# Patient Record
Sex: Female | Born: 1990
Health system: Southern US, Community
[De-identification: ages and names within clinical notes are randomized; demographics above are authoritative.]

## PROBLEM LIST (undated history)

## (undated) DIAGNOSIS — K8010 Calculus of gallbladder with chronic cholecystitis without obstruction: Principal | ICD-10-CM

## (undated) DIAGNOSIS — R011 Cardiac murmur, unspecified: Secondary | ICD-10-CM

## (undated) DIAGNOSIS — R519 Headache, unspecified: Secondary | ICD-10-CM

## (undated) DIAGNOSIS — R51 Headache: Secondary | ICD-10-CM

## (undated) DIAGNOSIS — K8020 Calculus of gallbladder without cholecystitis without obstruction: Secondary | ICD-10-CM

## (undated) HISTORY — DX: Calculus of gallbladder with chronic cholecystitis without obstruction: K80.10

## (undated) HISTORY — DX: Cardiac murmur, unspecified: R01.1

## (undated) HISTORY — DX: Headache: R51

## (undated) HISTORY — DX: Headache, unspecified: R51.9

## (undated) HISTORY — PX: CHOLECYSTECTOMY: SHX55

---

## 2012-12-30 ENCOUNTER — Other Ambulatory Visit: Payer: Self-pay | Admitting: Internal Medicine

## 2012-12-30 DIAGNOSIS — R1013 Epigastric pain: Secondary | ICD-10-CM

## 2012-12-31 ENCOUNTER — Other Ambulatory Visit: Payer: Self-pay | Admitting: Internal Medicine

## 2012-12-31 ENCOUNTER — Ambulatory Visit
Admission: RE | Admit: 2012-12-31 | Discharge: 2012-12-31 | Disposition: A | Discharge status: Home / Self Care | Payer: Federal, State, Local not specified - PPO | Source: Ambulatory Visit | Attending: Internal Medicine | Admitting: Internal Medicine

## 2012-12-31 DIAGNOSIS — R1013 Epigastric pain: Secondary | ICD-10-CM

## 2013-01-15 ENCOUNTER — Encounter (INDEPENDENT_AMBULATORY_CARE_PROVIDER_SITE_OTHER): Payer: Self-pay | Admitting: Surgery

## 2013-01-15 ENCOUNTER — Ambulatory Visit (INDEPENDENT_AMBULATORY_CARE_PROVIDER_SITE_OTHER): Payer: Federal, State, Local not specified - PPO | Admitting: Surgery

## 2013-01-15 VITALS — BP 116/82 | HR 72 | Temp 98.4°F | Resp 18 | Ht 66.0 in | Wt 222.6 lb

## 2013-01-15 DIAGNOSIS — E669 Obesity, unspecified: Secondary | ICD-10-CM

## 2013-01-15 DIAGNOSIS — K8010 Calculus of gallbladder with chronic cholecystitis without obstruction: Secondary | ICD-10-CM

## 2013-01-15 HISTORY — DX: Calculus of gallbladder with chronic cholecystitis without obstruction: K80.10

## 2013-01-15 NOTE — Progress Notes (Signed)
Subjective:     Patient ID: Brittney Blevins, female   DOB: Apr 25, 1991, 22 y.o.   MRN: [id number]  HPI  Brittney Blevins  Apr 04, 1991 [id number]  Patient Care Team: Thayer Headings, MD as PCP - General (Internal Medicine)  This patient is a 22 y.o.female who presents today for surgical evaluation at the request of Dr. Thea Silversmith.   Reason for evaluation: Intermittent epigastric pain with gallstones.  Pleasant obese healthy female.  Had severe episode of epigastric pain in 2012.  Nausea and bloating.  Has had intermittent attacks since.  They only last a few hours.  Often associated with eating but not always.  Sometime she has woken up in the morning with it.  No heartburn or reflux.  No relief with TUMS or milk of magnesia.  Has noticed her bowel movements have become more lead looser recently.  Walks easily.  Did a 1/2 mile walk several times a week.  Has been trying to lose weight.  Was on an HCG injection regimen.  She lost 30 pounds.  She started getting more attacks.  She has gained about 10 pounds back.  Had an ultrasound done which showed gallstones.  Recalls that her mother had to have her gallbladder removed in her 30s.  No history of falls or trauma.  She is a Consulting civil engineer at [employer].  She also as a IT sales professional at [employer].  There is no problem list on file for this patient.   History reviewed. No pertinent past medical history.  History reviewed. No pertinent past surgical history.  History   Social History  . Marital Status: Single    Spouse Name: N/A    Number of Children: N/A  . Years of Education: N/A   Occupational History  . Not on file.   Social History Main Topics  . Smoking status: Never Smoker   . Smokeless tobacco: Never Used  . Alcohol Use: Yes     Comment: Social  . Drug Use: No  . Sexually Active:    Other Topics Concern  . Not on file   Social History Narrative  . No narrative on file    History reviewed. No pertinent family history.  No  current outpatient prescriptions on file.     No Known Allergies  BP 116/82  Pulse 72  Temp 98.4 F (36.9 C) (Temporal)  Resp 18  Ht 5\' 6"  (1.676 m)  Wt 222 lb 9.6 oz (100.971 kg)  BMI 35.93 kg/m2  US Abdomen Limited Ruq  12/31/2012  *RADIOLOGY REPORT*  Clinical Data:  Intermittent epigastric pain.  LIMITED ABDOMINAL ULTRASOUND - RIGHT UPPER QUADRANT  Comparison:  None.  Findings:  Gallbladder:  There are two mobile echogenic gallstones.  The largest stone measures 1 cm in size.  Gallstones demonstrate posterior acoustic shadowing.  There is no evidence for gallbladder wall thickening or a sonographic Murphy's sign.  Common bile duct:  Measures 0.4 cm.  Liver:  Normal appearance of the liver parenchyma.  No focal abnormality.  Main portal vein is patent.  IMPRESSION: Cholelithiasis.                    Original Report Authenticated By: Richarda Overlie, M.D.      Review of Systems  Constitutional: Negative for fever, chills, diaphoresis, appetite change and fatigue.  HENT: Negative for ear pain, sore throat, trouble swallowing, neck pain and ear discharge.   Eyes: Negative for photophobia, discharge and visual disturbance.  Respiratory: Negative for cough, choking,  chest tightness and shortness of breath.   Cardiovascular: Negative for chest pain and palpitations.  Gastrointestinal: Positive for nausea, abdominal pain and abdominal distention. Negative for vomiting, diarrhea, constipation, blood in stool, anal bleeding and rectal pain.       No personal nor family history of GI/colon cancer, inflammatory bowel disease, irritable bowel syndrome, allergy such as Celiac Sprue, dietary/dairy problems, colitis, ulcers nor gastritis.  No recent sick contacts/gastroenteritis.  No travel outside the country.  No changes in diet.  No dysphagia to solids or liquids.  No heartburn or reflux supine or bending over.  Some postprandial bloating.  Genitourinary: Negative for dysuria, frequency and difficulty  urinating.  Musculoskeletal: Negative for myalgias and gait problem.  Skin: Negative for color change, pallor and rash.  Neurological: Negative for dizziness, speech difficulty, weakness and numbness.  Hematological: Negative for adenopathy.  Psychiatric/Behavioral: Negative for confusion and agitation. The patient is not nervous/anxious.        Objective:   Physical Exam  Constitutional: She is oriented to person, place, and time. She appears well-developed and well-nourished. No distress.  HENT:  Head: Normocephalic.  Mouth/Throat: Oropharynx is clear and moist. No oropharyngeal exudate.  Eyes: Conjunctivae normal and EOM are normal. Pupils are equal, round, and reactive to light. No scleral icterus.  Neck: Normal range of motion. Neck supple. No tracheal deviation present.  Cardiovascular: Normal rate, regular rhythm and intact distal pulses.   Pulmonary/Chest: Effort normal and breath sounds normal. No respiratory distress. She exhibits no tenderness.  Abdominal: Soft. She exhibits no distension and no mass. There is no tenderness. Hernia confirmed negative in the right inguinal area and confirmed negative in the left inguinal area.       Pear-shaped body habitus.  Obese but soft  Genitourinary: No vaginal discharge found.  Musculoskeletal: Normal range of motion. She exhibits no tenderness.  Lymphadenopathy:    She has no cervical adenopathy.       Right: No inguinal adenopathy present.       Left: No inguinal adenopathy present.  Neurological: She is alert and oriented to person, place, and time. No cranial nerve deficit. She exhibits normal muscle tone. Coordination normal.  Skin: Skin is warm and dry. No rash noted. She is not diaphoretic. No erythema.  Psychiatric: She has a normal mood and affect. Her behavior is normal. Judgment and thought content normal.       Assessment:     Gallstones with intermittent epigastric pain and nausea and bloating for the past two years.   Strongly suspicious for chronic cholecystitis.  Heartburn reflux and rest of the differential less likely.    Plan:     I suspect she needs a cholecystectomy for symptomatic cholecystolithiasis with probable chronic cholecystitis.  I offered her several options.  One is to consider a gastroenterology consultation With possible EGD and other workup to rule out other etiologies.  Another is a trial of proton pump inhibitors omeprazole 20 mg by mouth twice a day.  If not better by three weeks, consider surgery.  The most aggressive and probably most reasonable option is to do a cholecystectomy given the fact that the rest of the differential diagnosis seems unlikely.  She would rather proceed with surgery first.  She would like to plan this or on the time of [facility] spring break in mid March.  I did discuss it with her:  The anatomy & physiology of hepatobiliary & pancreatic function was discussed.  The pathophysiology of gallbladder dysfunction  was discussed.  Natural history risks without surgery was discussed.   I feel the risks of no intervention will lead to serious problems that outweigh the operative risks; therefore, I recommended cholecystectomy to remove the pathology.  I explained laparoscopic techniques with possible need for an open approach.  Probable cholangiogram to evaluate the bilary tract was explained as well.    Risks such as bleeding, infection, abscess, leak, injury to other organs, need for further treatment, heart attack, death, and other risks were discussed.  I noted a good likelihood this will help address the problem.  Possibility that this will not correct all abdominal symptoms was explained.  Goals of post-operative recovery were discussed as well.  We will work to minimize complications.  An educational handout further explaining the pathology and treatment options was given as well.  Questions were answered.  The patient expresses understanding & wishes to proceed with  surgery.

## 2013-01-15 NOTE — Patient Instructions (Signed)
See the Handout(s) we gave you.  Consider surgery.  Please call our office at [phone] if you wish to schedule surgery or if you have further questions / concerns.   Consider a trial of aggressive and has since with a proton pump inhibitor (omeprazole 20 mg two times a day) for three weeks.  If you have heartburn as the source, that should control it by three weeks.  Cholelithiasis Cholelithiasis (also called gallstones) is a form of gallbladder disease where gallstones form in your gallbladder. The gallbladder is a non-essential organ that stores bile made in the liver, which helps digest fats. Gallstones begin as small crystals and slowly grow into stones. Gallstone pain occurs when the gallbladder spasms, and a gallstone is blocking the duct. Pain can also occur when a stone passes out of the duct.  Women are more likely to develop gallstones than men. Other factors that increase the risk of gallbladder disease are:  Having multiple pregnancies. Physicians sometimes advise removing diseased gallbladders before future pregnancies.  Obesity.  Diets heavy in fried foods and fat.  Increasing age (older than 36).  Prolonged use of medications containing female hormones.  Diabetes mellitus.  Rapid weight loss.  Family history of gallstones (heredity). SYMPTOMS  Feeling sick to your stomach (nauseous).  Abdominal pain.  Yellowing of the skin (jaundice).  Sudden pain. It may persist from several minutes to several hours.  Worsening pain with deep breathing or when jarred.  Fever.  Tenderness to the touch. In some cases, when gallstones do not move into the bile duct, people have no pain or symptoms. These are called "silent" gallstones. TREATMENT In severe cases, emergency surgery may be required. HOME CARE INSTRUCTIONS   Only take over-the-counter or prescription medicines for pain, discomfort, or fever as directed by your caregiver.  Follow a low-fat diet until seen  again. Fat causes the gallbladder to contract, which can result in pain.  Follow up as instructed. Attacks are almost always recurrent and surgery is usually required for permanent treatment. SEEK IMMEDIATE MEDICAL CARE IF:   Your pain increases and is not controlled by medications.  You have an oral temperature above 102 F (38.9 C), not controlled by medication.  You develop nausea and vomiting. MAKE SURE YOU:   Understand these instructions.  Will watch your condition.  Will get help right away if you are not doing well or get worse. Document Released: 12/07/2005 Document Revised: 03/04/2012 Document Reviewed: 02/09/2011 [facility] Patient Information 2013 [place], [place].

## 2013-02-14 ENCOUNTER — Encounter (HOSPITAL_COMMUNITY): Payer: Self-pay | Admitting: Pharmacy Technician

## 2013-02-18 NOTE — Patient Instructions (Addendum)
19 Brittney Blevins  02/18/2013   Your procedure is scheduled on: 3-7  -2014  Report to [facility] at    0530    AM .  Call this number if you have problems the morning of surgery: [phone]  Or Presurgical Testing [phone](Haim Hansson)      Do not eat food:After Midnight.    Take these medicines the morning of surgery with A SIP OF WATER: none.   Do not wear jewelry, make-up or nail polish.  Do not wear lotions, powders, or perfumes. You may wear deodorant.  Do not shave 12 hours prior to first CHG shower(legs and under arms).(face and neck okay.)  Do not bring valuables to the hospital.  Contacts, dentures or bridgework,body piercing,  may not be worn into surgery.  Leave suitcase in the car. After surgery it may be brought to your room.  For patients admitted to the hospital, checkout time is 11:00 AM the day of discharge.   Patients discharged the day of surgery will not be allowed to drive home. Must have responsible person with you x 24 hours once discharged.  Name and phone number of your driver: Mother-[name]-[phone]  Special Instructions: CHG(Chlorhedine 4%-"Hibiclens","Betasept","Aplicare") Shower Use Special Wash: see special instructions.(avoid face and genitals)   Please read over the following fact sheets that you were given: MRSA Information.    Failure to follow these instructions may result in Cancellation of your surgery.   Patient signature_______________________________________________________

## 2013-02-19 ENCOUNTER — Encounter (HOSPITAL_COMMUNITY)
Admission: RE | Admit: 2013-02-19 | Discharge: 2013-02-19 | Disposition: A | Discharge status: Home / Self Care | Payer: Federal, State, Local not specified - PPO | Source: Ambulatory Visit | Attending: Surgery | Admitting: Surgery

## 2013-02-19 ENCOUNTER — Encounter (HOSPITAL_COMMUNITY): Payer: Self-pay

## 2013-02-19 DIAGNOSIS — K8020 Calculus of gallbladder without cholecystitis without obstruction: Secondary | ICD-10-CM

## 2013-02-19 HISTORY — DX: Calculus of gallbladder without cholecystitis without obstruction: K80.20

## 2013-02-19 LAB — HCG, SERUM, QUALITATIVE: Preg, Serum: NEGATIVE

## 2013-02-19 LAB — SURGICAL PCR SCREEN: Staphylococcus aureus: NEGATIVE

## 2013-02-21 ENCOUNTER — Encounter (INDEPENDENT_AMBULATORY_CARE_PROVIDER_SITE_OTHER): Payer: Self-pay

## 2013-02-27 NOTE — Anesthesia Preprocedure Evaluation (Addendum)

## 2013-02-28 ENCOUNTER — Encounter (HOSPITAL_COMMUNITY)
Admission: RE | Disposition: A | Discharge status: Home / Self Care | Payer: Self-pay | Source: Ambulatory Visit | Attending: Surgery

## 2013-02-28 ENCOUNTER — Ambulatory Visit (HOSPITAL_COMMUNITY): Payer: Federal, State, Local not specified - PPO | Admitting: Anesthesiology

## 2013-02-28 ENCOUNTER — Encounter (HOSPITAL_COMMUNITY): Payer: Self-pay | Admitting: *Deleted

## 2013-02-28 ENCOUNTER — Ambulatory Visit (HOSPITAL_COMMUNITY)
Admission: RE | Admit: 2013-02-28 | Discharge: 2013-02-28 | Disposition: A | Discharge status: Home / Self Care | Payer: Federal, State, Local not specified - PPO | Source: Ambulatory Visit | Attending: Surgery | Admitting: Surgery

## 2013-02-28 ENCOUNTER — Encounter (HOSPITAL_COMMUNITY): Payer: Self-pay | Admitting: Anesthesiology

## 2013-02-28 ENCOUNTER — Ambulatory Visit (HOSPITAL_COMMUNITY): Payer: Federal, State, Local not specified - PPO

## 2013-02-28 DIAGNOSIS — K8010 Calculus of gallbladder with chronic cholecystitis without obstruction: Secondary | ICD-10-CM

## 2013-02-28 DIAGNOSIS — Z01812 Encounter for preprocedural laboratory examination: Secondary | ICD-10-CM | POA: Insufficient documentation

## 2013-02-28 HISTORY — PX: LAPAROSCOPIC CHOLECYSTECTOMY SINGLE PORT: SHX5891

## 2013-02-28 SURGERY — LAPAROSCOPIC CHOLECYSTECTOMY SINGLE SITE
Anesthesia: General | Wound class: Clean Contaminated

## 2013-02-28 MED ORDER — PROPOFOL 10 MG/ML IV BOLUS
INTRAVENOUS | Status: DC | PRN
  Administered 2013-02-28: 200 mg via INTRAVENOUS

## 2013-02-28 MED ORDER — FENTANYL CITRATE 0.05 MG/ML IJ SOLN
INTRAMUSCULAR | Status: AC
  Filled 2013-02-28: qty 2

## 2013-02-28 MED ORDER — ACETAMINOPHEN 325 MG PO TABS: 650.0000 mg | ORAL_TABLET | ORAL | Status: DC | PRN

## 2013-02-28 MED ORDER — CHLORHEXIDINE GLUCONATE 4 % EX LIQD
1.0000 "application " | Freq: Once | CUTANEOUS | Status: DC
  Filled 2013-02-28: qty 15

## 2013-02-28 MED ORDER — BUPIVACAINE-EPINEPHRINE 0.25% -1:200000 IJ SOLN
INTRAMUSCULAR | Status: DC | PRN
  Administered 2013-02-28: 50 mL

## 2013-02-28 MED ORDER — DEXAMETHASONE SODIUM PHOSPHATE 10 MG/ML IJ SOLN
INTRAMUSCULAR | Status: DC | PRN
  Administered 2013-02-28: 10 mg via INTRAVENOUS

## 2013-02-28 MED ORDER — PHENTERMINE HCL 37.5 MG PO TABS: 37.5000 mg | ORAL_TABLET | Freq: Every day | ORAL | Status: DC

## 2013-02-28 MED ORDER — OXYCODONE HCL 5 MG PO TABS
5.0000 mg | ORAL_TABLET | ORAL | Status: DC | PRN
  Administered 2013-02-28: 5 mg via ORAL
  Filled 2013-02-28: qty 1

## 2013-02-28 MED ORDER — ACETAMINOPHEN 10 MG/ML IV SOLN
INTRAVENOUS | Status: AC
  Filled 2013-02-28: qty 100

## 2013-02-28 MED ORDER — ROCURONIUM BROMIDE 100 MG/10ML IV SOLN
INTRAVENOUS | Status: DC | PRN
  Administered 2013-02-28: 10 mg via INTRAVENOUS
  Administered 2013-02-28: 60 mg via INTRAVENOUS

## 2013-02-28 MED ORDER — ACETAMINOPHEN 10 MG/ML IV SOLN
INTRAVENOUS | Status: DC | PRN
  Administered 2013-02-28: 1000 mg via INTRAVENOUS

## 2013-02-28 MED ORDER — SODIUM CHLORIDE 0.9 % IJ SOLN: 3.0000 mL | INTRAMUSCULAR | Status: DC | PRN

## 2013-02-28 MED ORDER — KETOROLAC TROMETHAMINE 30 MG/ML IJ SOLN
INTRAMUSCULAR | Status: DC | PRN
  Administered 2013-02-28: 30 mg via INTRAVENOUS

## 2013-02-28 MED ORDER — ACETAMINOPHEN 650 MG RE SUPP
650.0000 mg | RECTAL | Status: DC | PRN
  Filled 2013-02-28: qty 1

## 2013-02-28 MED ORDER — SODIUM CHLORIDE 0.9 % IJ SOLN: 3.0000 mL | Freq: Two times a day (BID) | INTRAMUSCULAR | Status: DC

## 2013-02-28 MED ORDER — ONDANSETRON HCL 4 MG/2ML IJ SOLN: 4.0000 mg | Freq: Four times a day (QID) | INTRAMUSCULAR | Status: DC | PRN

## 2013-02-28 MED ORDER — ONDANSETRON HCL 4 MG/2ML IJ SOLN
INTRAMUSCULAR | Status: DC | PRN
  Administered 2013-02-28: 4 mg via INTRAVENOUS

## 2013-02-28 MED ORDER — IOHEXOL 300 MG/ML  SOLN
INTRAMUSCULAR | Status: AC
  Filled 2013-02-28: qty 1

## 2013-02-28 MED ORDER — OXYCODONE HCL 5 MG PO TABS: 5.0000 mg | ORAL_TABLET | ORAL | Status: DC | PRN

## 2013-02-28 MED ORDER — SODIUM CHLORIDE 0.9 % IV SOLN: 250.0000 mL | INTRAVENOUS | Status: DC | PRN

## 2013-02-28 MED ORDER — MIDAZOLAM HCL 5 MG/5ML IJ SOLN
INTRAMUSCULAR | Status: DC | PRN
  Administered 2013-02-28: 2 mg via INTRAVENOUS

## 2013-02-28 MED ORDER — GLYCOPYRROLATE 0.2 MG/ML IJ SOLN
INTRAMUSCULAR | Status: DC | PRN
  Administered 2013-02-28: 0.6 mg via INTRAVENOUS

## 2013-02-28 MED ORDER — FENTANYL CITRATE 0.05 MG/ML IJ SOLN: 25.0000 ug | INTRAMUSCULAR | Status: DC | PRN

## 2013-02-28 MED ORDER — EPHEDRINE SULFATE 50 MG/ML IJ SOLN
INTRAMUSCULAR | Status: DC | PRN
  Administered 2013-02-28: 10 mg via INTRAVENOUS

## 2013-02-28 MED ORDER — LACTATED RINGERS IV SOLN: INTRAVENOUS | Status: DC

## 2013-02-28 MED ORDER — STERILE WATER FOR IRRIGATION IR SOLN
Status: DC | PRN
  Administered 2013-02-28: 1500 mL

## 2013-02-28 MED ORDER — BUPIVACAINE-EPINEPHRINE 0.5% -1:200000 IJ SOLN
INTRAMUSCULAR | Status: AC
  Filled 2013-02-28: qty 1

## 2013-02-28 MED ORDER — LACTATED RINGERS IV SOLN
INTRAVENOUS | Status: DC | PRN
  Administered 2013-02-28 (×2): via INTRAVENOUS

## 2013-02-28 MED ORDER — FENTANYL CITRATE 0.05 MG/ML IJ SOLN
INTRAMUSCULAR | Status: DC | PRN
  Administered 2013-02-28: 50 ug via INTRAVENOUS
  Administered 2013-02-28 (×2): 100 ug via INTRAVENOUS

## 2013-02-28 MED ORDER — BUPIVACAINE-EPINEPHRINE 0.25% -1:200000 IJ SOLN
INTRAMUSCULAR | Status: AC
  Filled 2013-02-28: qty 1

## 2013-02-28 MED ORDER — NEOSTIGMINE METHYLSULFATE 1 MG/ML IJ SOLN
INTRAMUSCULAR | Status: DC | PRN
  Administered 2013-02-28: 4 mg via INTRAVENOUS

## 2013-02-28 MED ORDER — FENTANYL CITRATE 0.05 MG/ML IJ SOLN
25.0000 ug | INTRAMUSCULAR | Status: DC | PRN
  Administered 2013-02-28 (×2): 50 ug via INTRAVENOUS

## 2013-02-28 MED ORDER — 0.9 % SODIUM CHLORIDE (POUR BTL) OPTIME
TOPICAL | Status: DC | PRN
  Administered 2013-02-28: 1000 mL

## 2013-02-28 MED ORDER — LIDOCAINE HCL (PF) 2 % IJ SOLN
INTRAMUSCULAR | Status: DC | PRN
  Administered 2013-02-28: 75 mg

## 2013-02-28 SURGICAL SUPPLY — 48 items
APPLIER CLIP 5 13 M/L LIGAMAX5 (MISCELLANEOUS) ×2
APPLIER CLIP ROT 10 11.4 M/L (STAPLE)
APPLIER CLIP UNV 5X34 EPIX (ENDOMECHANICALS) ×2 IMPLANT
CABLE HIGH FREQUENCY MONO STRZ (ELECTRODE) ×2 IMPLANT
CANISTER SUCTION 2500CC (MISCELLANEOUS) ×2 IMPLANT
CLIP APPLIE 5 13 M/L LIGAMAX5 (MISCELLANEOUS) ×1 IMPLANT
CLIP APPLIE ROT 10 11.4 M/L (STAPLE) IMPLANT
CLOTH BEACON ORANGE TIMEOUT ST (SAFETY) ×2 IMPLANT
COVER MAYO STAND STRL (DRAPES) ×2 IMPLANT
DECANTER SPIKE VIAL GLASS SM (MISCELLANEOUS) ×2 IMPLANT
DRAIN CHANNEL 19F RND (DRAIN) IMPLANT
DRAPE C-ARM 42X72 X-RAY (DRAPES) ×2 IMPLANT
DRAPE LAPAROSCOPIC ABDOMINAL (DRAPES) ×2 IMPLANT
DRAPE WARM FLUID 44X44 (DRAPE) ×2 IMPLANT
DRSG TEGADERM 4X4.75 (GAUZE/BANDAGES/DRESSINGS) ×2 IMPLANT
ELECT REM PT RETURN 9FT ADLT (ELECTROSURGICAL) ×2
ELECTRODE REM PT RTRN 9FT ADLT (ELECTROSURGICAL) ×1 IMPLANT
EVACUATOR SILICONE 100CC (DRAIN) IMPLANT
GAUZE SPONGE 2X2 8PLY STRL LF (GAUZE/BANDAGES/DRESSINGS) ×1 IMPLANT
GLOVE BIOGEL PI IND STRL 7.0 (GLOVE) ×1 IMPLANT
GLOVE BIOGEL PI INDICATOR 7.0 (GLOVE) ×1
GLOVE ECLIPSE 8.0 STRL XLNG CF (GLOVE) ×2 IMPLANT
GLOVE INDICATOR 8.0 STRL GRN (GLOVE) ×2 IMPLANT
GOWN STRL NON-REIN LRG LVL3 (GOWN DISPOSABLE) IMPLANT
GOWN STRL REIN XL XLG (GOWN DISPOSABLE) ×4 IMPLANT
IV LACTATED RINGER IRRG 3000ML (IV SOLUTION) ×1
IV LR IRRIG 3000ML ARTHROMATIC (IV SOLUTION) ×1 IMPLANT
KIT BASIN OR (CUSTOM PROCEDURE TRAY) ×2 IMPLANT
NS IRRIG 1000ML POUR BTL (IV SOLUTION) ×2 IMPLANT
POUCH SPECIMEN RETRIEVAL 10MM (ENDOMECHANICALS) IMPLANT
SCALPEL HARMONIC ACE (MISCELLANEOUS) ×2 IMPLANT
SCISSORS LAP 5X35 DISP (ENDOMECHANICALS) ×2 IMPLANT
SET CHOLANGIOGRAPH MIX (MISCELLANEOUS) ×2 IMPLANT
SET IRRIG TUBING LAPAROSCOPIC (IRRIGATION / IRRIGATOR) ×2 IMPLANT
SOLUTION ANTI FOG 6CC (MISCELLANEOUS) IMPLANT
SPONGE GAUZE 2X2 STER 10/PKG (GAUZE/BANDAGES/DRESSINGS) ×1
STRIP CLOSURE SKIN 1/2X4 (GAUZE/BANDAGES/DRESSINGS) IMPLANT
SUT MNCRL AB 4-0 PS2 18 (SUTURE) ×2 IMPLANT
SUT VICRYL 0 TIES 12 18 (SUTURE) IMPLANT
SUT VICRYL 0 UR6 27IN ABS (SUTURE) ×2 IMPLANT
TOWEL OR 17X26 10 PK STRL BLUE (TOWEL DISPOSABLE) ×2 IMPLANT
TRAY LAP CHOLE (CUSTOM PROCEDURE TRAY) ×2 IMPLANT
TROCAR 5M 150ML BLDLS (TROCAR) ×2 IMPLANT
TROCAR BLADELESS OPT 5 100 (ENDOMECHANICALS) ×2 IMPLANT
TROCAR BLADELESS OPT 5 150 (ENDOMECHANICALS) ×2 IMPLANT
TROCAR Z-THREAD FIOS 11X100 BL (TROCAR) IMPLANT
TROCAR Z-THREAD FIOS 5X100MM (TROCAR) ×2 IMPLANT
TUBING INSUFFLATION 10FT LAP (TUBING) ×2 IMPLANT

## 2013-02-28 NOTE — Op Note (Signed)
02/28/2013  8:45 AM  PATIENT:  Brittney Blevins  22 y.o. female  Patient Care Team: Thayer Headings, MD as PCP - General (Internal Medicine)  PRE-OPERATIVE DIAGNOSIS:  chronic cholecystitis   POST-OPERATIVE DIAGNOSIS:  chronic cholecystitis   PROCEDURE:  Procedure(s): LAPAROSCOPIC CHOLECYSTECTOMY SINGLE SITE  SURGEON:  Surgeon(s): Ardeth Sportsman, MD  ASSISTANT: RN   ANESTHESIA:   local and general  EBL:  Total I/O In: 1000 [I.V.:1000] Out: -   Delay start of Pharmacological VTE agent (>24hrs) due to surgical blood loss or risk of bleeding:  no  DRAINS: none   SPECIMEN:  Source of Specimen:  Gallbladder  DISPOSITION OF SPECIMEN:  PATHOLOGY  COUNTS:  YES  PLAN OF CARE: Discharge to home after PACU  PATIENT DISPOSITION:  PACU - hemodynamically stable.  INDICATION: Pleasant obese female with episodes of epigastric pain.  Found to have gallstones.  Differential diagnosis less likely.  I recommended cholecystectomy:  The anatomy & physiology of hepatobiliary & pancreatic function was discussed.  The pathophysiology of gallbladder dysfunction was discussed.  Natural history risks without surgery was discussed.   I feel the risks of no intervention will lead to serious problems that outweigh the operative risks; therefore, I recommended cholecystectomy to remove the pathology.  I explained laparoscopic techniques with possible need for an open approach.  Probable cholangiogram to evaluate the bilary tract was explained as well.    Risks such as bleeding, infection, abscess, leak, injury to other organs, need for further treatment, heart attack, death, and other risks were discussed.  I noted a good likelihood this will help address the problem.  Possibility that this will not correct all abdominal symptoms was explained.  Goals of post-operative recovery were discussed as well.  We will work to minimize complications.  An educational handout further explaining the pathology and  treatment options was given as well.  Questions were answered.  The patient expresses understanding & wishes to proceed with surgery.   OR FINDINGS: White leathery changes the gallbladder consistent with cholecystitis.  Fibrotic cystic duct.  Unable to cannulate for cholangiogram.  Solitary 8mm gallstone.  Rest of abdomen normal.  DESCRIPTION:   The patient was identified & brought in the operating room. The patient was positioned supine with arms tucked. SCDs were active during the entire case. The patient underwent general anesthesia without any difficulty.  The abdomen was prepped and draped in a sterile fashion. A Surgical Timeout confirmed our plan.  I made a transverse curvilinear incision through the superior umbilical fold.  I placed a 5mm long port through the supraumbilical fascia using a modified Hassan cutdown technique. I began carbon dioxide insufflation. Camera inspection revealed no injury. There were no adhesions to the anterior abdominal wall supraumbilically.  I proceeded to continue with single site technique. I placed a #5 port in left upper aspect of the wound. I placed a 5 mm atraumatic grasper in the right inferior aspect of the wound.  I turned attention to the right upper quadrant.  The gallbladder fundus was elevated cephalad. I freed the peritoneal coverings between the gallbladder and the liver on the posteriolateral and anteriomedial walls. I alternated between Harmonic & blunt [place] dissection to help get a good critical view of the cystic artery and cystic duct. I did further dissection to free a few centimeters of the  gallbladder off the liver bed to get a good critical view of the infundibulum and cystic duct. I mobilized the cystic artery; and, after getting a  good 360 view, ligated the cystic artery using the Harmonic ultrasonic dissection. I skeletonized the cystic duct.  I placed a clip on the infundibulum. I did a partial cystic duct-otomy and ensured patency.  I placed a 5 [place] cholangiocatheter through a puncture site at the right subcostal ridge of the abdominal wall and directed it into the cystic duct.  However, I cannot get it to advance beyond a few millimeters.  I made a new opening more proximally.  Still cannot cannulate.  It was rather fibrotic near the junction with the common bile duct.  I decided to abort cholangiogram to avoid common bile duct injury.  I removed the cholangiocatheter. I placed clips on the cystic duct x4.  I completed cystic duct transection. I freed the gallbladder from its remaining attachments to the liver. I ensured hemostasis on the gallbladder fossa of the liver and elsewhere. I inspected the rest of the abdomen & detected no injury nor bleeding elsewhere.  I removed the gallbladder out the supraumbilical fascia. I closed the fascia transversely using 0 Vicryl interrupted stitches. A closed the skin using 4-0 monocryl stitch.  Sterile dressing was applied. The patient was extubated & arrived in the PACU in stable condition..  I had discussed postoperative care with the patient in the holding area. I am about to locate the patient's family and discuss operative findings and postoperative goals / instructions.  Instructions are written in the chart as well.

## 2013-02-28 NOTE — Anesthesia Postprocedure Evaluation (Signed)
  Anesthesia Post-op Note  Patient: Brittney Blevins  Procedure(s) Performed: Procedure(s) (LRB): LAPAROSCOPIC CHOLECYSTECTOMY SINGLE SITE (N/A)  Patient Location: PACU  Anesthesia Type: General  Level of Consciousness: awake and alert   Airway and Oxygen Therapy: Patient Spontanous Breathing  Post-op Pain: mild  Post-op Assessment: Post-op Vital signs reviewed, Patient's Cardiovascular Status Stable, Respiratory Function Stable, Patent Airway and No signs of Nausea or vomiting  Last Vitals:  Filed Vitals:   02/28/13 0930  BP: 110/63  Pulse: 66  Temp: 36.1 C  Resp: 18    Post-op Vital Signs: stable   Complications: No apparent anesthesia complications

## 2013-02-28 NOTE — Transfer of Care (Signed)
Immediate Anesthesia Transfer of Care Note  Patient: Brittney Blevins  Procedure(s) Performed: Procedure(s): LAPAROSCOPIC CHOLECYSTECTOMY SINGLE SITE (N/A)  Patient Location: PACU  Anesthesia Type:General  Level of Consciousness: awake, alert , oriented and patient cooperative  Airway & Oxygen Therapy: Patient Spontanous Breathing and Patient connected to face mask oxygen  Post-op Assessment: Report given to PACU RN and Post -op Vital signs reviewed and stable  Post vital signs: Reviewed and stable  Complications: No apparent anesthesia complications

## 2013-02-28 NOTE — H&P (Signed)
Brittney Blevins  17-Nov-1991 [id number]   This patient is a 22 y.o.female who presents today for surgical evaluation at the request of Dr. Thea Silversmith.   Reason for evaluation: Intermittent epigastric pain with gallstones.   Pleasant obese healthy female. Had severe episode of epigastric pain in 2012. Nausea and bloating. Has had intermittent attacks since. They only last a few hours. Often associated with eating but not always. Sometime she has woken up in the morning with it. No heartburn or reflux. No relief with TUMS or milk of magnesia. Has noticed her bowel movements have become more lead looser recently. Walks easily. Did a 1/2 mile walk several times a week. Has been trying to lose weight. Was on an HCG injection regimen. She lost 30 pounds. She started getting more attacks. She has gained about 10 pounds back. Had an ultrasound done which showed gallstones. Recalls that her mother had to have her gallbladder removed in her 30s. No history of falls or trauma. 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   No new events    Past Medical History  Diagnosis Date  . Cholelithiasis 02-19-13    surgery planned 02-28-13    History reviewed. No pertinent past surgical history.  History   Social History  . Marital Status: Single    Spouse Name: N/A    Number of Children: N/A  . Years of Education: N/A   Occupational History  . Not on file.   Social History Main Topics  . Smoking status: Never Smoker   . Smokeless tobacco: Never Used  . Alcohol Use: Yes     Comment: rare-Social  . Drug Use: No  . Sexually Active: No   Other Topics Concern  . Not on file   Social History Narrative  . No narrative on file    History reviewed. No pertinent family history.  Current Facility-Administered Medications  Medication Dose Route Frequency Provider Last Rate Last Dose  . chlorhexidine (HIBICLENS) 4 % liquid 1 application  1 application Topical Once  Ardeth Sportsman, MD      . Melene Muller ON 03/01/2013] chlorhexidine (HIBICLENS) 4 % liquid 1 application  1 application Topical Once Ardeth Sportsman, MD         No Known Allergies    BP 131/78  Pulse 85  Temp(Src) 98.1 F (36.7 C) (Oral)  Resp 18  SpO2 100%  LMP 02/26/2013     Results:   Labs: No results found for this or any previous visit (from the past 48 hour(s)).  Imaging / Studies: No results found.  Medications / Allergies: per chart  Antibiotics: Anti-infectives   None      Review of Systems  Constitutional: Negative for fever, chills, diaphoresis, appetite change and fatigue.  HENT: Negative for ear pain, sore throat, trouble swallowing, neck pain and ear discharge.  Eyes: Negative for photophobia, discharge and visual disturbance.  Respiratory: Negative for cough, choking, chest tightness and shortness of breath.  Cardiovascular: Negative for chest pain and palpitations.  Gastrointestinal: Positive for nausea, abdominal pain and abdominal distention. Negative for vomiting, diarrhea, constipation, blood in stool, anal bleeding and rectal pain.  No personal nor family history of GI/colon cancer, inflammatory bowel disease, irritable bowel syndrome, allergy such as Celiac Sprue, dietary/dairy problems, colitis, ulcers nor gastritis. No recent sick contacts/gastroenteritis. No travel outside the country. No changes in diet.  No dysphagia to solids or liquids. No heartburn or reflux supine or  bending over. Some postprandial bloating.  Genitourinary: Negative for dysuria, frequency and difficulty urinating.  Musculoskeletal: Negative for myalgias and gait problem.  Skin: Negative for color change, pallor and rash.  Neurological: Negative for dizziness, speech difficulty, weakness and numbness.  Hematological: Negative for adenopathy.  Psychiatric/Behavioral: Negative for confusion and agitation. The patient is not nervous/anxious.  Objective:   Physical Exam   Constitutional: She is oriented to person, place, and time. She appears well-developed and well-nourished. No distress.  HENT:  Head: Normocephalic.  Mouth/Throat: Oropharynx is clear and moist. No oropharyngeal exudate.  Eyes: Conjunctivae normal and EOM are normal. Pupils are equal, round, and reactive to light. No scleral icterus.  Neck: Normal range of motion. Neck supple. No tracheal deviation present.  Cardiovascular: Normal rate, regular rhythm and intact distal pulses.  Pulmonary/Chest: Effort normal and breath sounds normal. No respiratory distress. She exhibits no tenderness.  Abdominal: Soft. She exhibits no distension and no mass. There is no tenderness. Hernia confirmed negative in the right inguinal area and confirmed negative in the left inguinal area.  Pear-shaped body habitus. Obese but soft  Genitourinary: No vaginal discharge found.  Musculoskeletal: Normal range of motion. She exhibits no tenderness.  Lymphadenopathy:  She has no cervical adenopathy.  Right: No inguinal adenopathy present.  Left: No inguinal adenopathy present.  Neurological: She is alert and oriented to person, place, and time. No cranial nerve deficit. She exhibits normal muscle tone. Coordination normal.  Skin: Skin is warm and dry. No rash noted. She is not diaphoretic. No erythema.  Psychiatric: She has a normal mood and affect. Her behavior is normal. Judgment and thought content normal.  Assessment:   Gallstones with intermittent epigastric pain and nausea and bloating for the past two years. Strongly suspicious for chronic cholecystitis. Heartburn reflux and rest of the differential less likely.  Plan:   I suspect she needs a cholecystectomy for symptomatic cholecystolithiasis with probable chronic cholecystitis. I offered her several options. One is to consider a gastroenterology consultation With possible EGD and other workup to rule out other etiologies. Another is a trial of proton pump  inhibitors omeprazole 20 mg by mouth twice a day. If not better by three weeks, consider surgery. The most aggressive and probably most reasonable option is to do a cholecystectomy given the fact that the rest of the differential diagnosis seems unlikely. She would rather proceed with surgery first. She would like to plan this or on the time of [facility] spring break in mid March. I did discuss it with her:   The anatomy & physiology of hepatobiliary & pancreatic function was discussed. The pathophysiology of gallbladder dysfunction was discussed. Natural history risks without surgery was discussed. I feel the risks of no intervention will lead to serious problems that outweigh the operative risks; therefore, I recommended cholecystectomy to remove the pathology. I explained laparoscopic techniques with possible need for an open approach. Probable cholangiogram to evaluate the bilary tract was explained as well.  Risks such as bleeding, infection, abscess, leak, injury to other organs, need for further treatment, heart attack, death, and other risks were discussed. I noted a good likelihood this will help address the problem. Possibility that this will not correct all abdominal symptoms was explained. Goals of post-operative recovery were discussed as well. We will work to minimize complications. An educational handout further explaining the pathology and treatment options was given as well. Questions were answered. The patient expresses understanding & wishes to proceed with surgery.   I  have re-reviewed the the patient's records, history, medications, and allergies.  I have re-examined the patient.  I again discussed intraoperative plans and goals of post-operative recovery.  The patient agrees to proceed.     Ardeth Sportsman, M.D., F.A.C.S. Gastrointestinal and Minimally Invasive Surgery [facility] [address], [address] [place], [place] [address] [phone] [address]  / Paging [phone] Voice Mail   02/28/2013  CARE TEAM:  PCP: Thayer Headings, MD  Outpatient Care Team: Patient Care Team: Thayer Headings, MD as PCP - General (Internal Medicine)  Inpatient Treatment Team: Treatment Team: Attending Provider: Ardeth Sportsman, MD

## 2013-03-03 ENCOUNTER — Encounter (HOSPITAL_COMMUNITY): Payer: Self-pay | Admitting: Surgery

## 2013-03-17 ENCOUNTER — Ambulatory Visit (INDEPENDENT_AMBULATORY_CARE_PROVIDER_SITE_OTHER): Payer: Federal, State, Local not specified - PPO | Admitting: Surgery

## 2013-03-17 ENCOUNTER — Encounter (INDEPENDENT_AMBULATORY_CARE_PROVIDER_SITE_OTHER): Payer: Self-pay | Admitting: Surgery

## 2013-03-17 VITALS — BP 110/64 | HR 70 | Resp 18 | Ht 66.0 in | Wt 219.0 lb

## 2013-03-17 DIAGNOSIS — K8010 Calculus of gallbladder with chronic cholecystitis without obstruction: Secondary | ICD-10-CM

## 2013-03-17 NOTE — Patient Instructions (Addendum)
LAPAROSCOPIC SURGERY: POST OP INSTRUCTIONS  1. DIET: Follow a light bland diet the first 24 hours after arrival home, such as soup, liquids, crackers, etc.  Be sure to include lots of fluids daily.  Avoid fast food or heavy meals as your are more likely to get nauseated.  Eat a low fat the next few days after surgery.   2. Take your usually prescribed home medications unless otherwise directed. 3. PAIN CONTROL: a. Pain is best controlled by a usual combination of three different methods TOGETHER: i. Ice/Heat ii. Over the counter pain medication iii. Prescription pain medication b. Most patients will experience some swelling and bruising around the incisions.  Ice packs or heating pads (30-60 minutes up to 6 times a day) will help. Use ice for the first few days to help decrease swelling and bruising, then switch to heat to help relax tight/sore spots and speed recovery.  Some people prefer to use ice alone, heat alone, alternating between ice & heat.  Experiment to what works for you.  Swelling and bruising can take several weeks to resolve.   c. It is helpful to take an over-the-counter pain medication regularly for the first few weeks.  Choose one of the following that works best for you: i. Naproxen (Aleve, etc)  Two 236m tabs twice a day ii. Ibuprofen (Advil, etc) Three 2060mtabs four times a day (every meal & bedtime) iii. Acetaminophen (Tylenol, etc) 500-65025mour times a day (every meal & bedtime) d. A  prescription for pain medication (such as oxycodone, hydrocodone, etc) should be given to you upon discharge.  Take your pain medication as prescribed.  i. If you are having problems/concerns with the prescription medicine (does not control pain, nausea, vomiting, rash, itching, etc), please call us [phone] see if we need to switch you to a different pain medicine that will work better for you and/or control your side effect better. ii. If you need a refill on your pain medication,  please contact your pharmacy.  They will contact our office to request authorization. Prescriptions will not be filled after 5 pm or on week-ends. 4. Avoid getting constipated.  Between the surgery and the pain medications, it is common to experience some constipation.  Increasing fluid intake and taking a fiber supplement (such as Metamucil, Citrucel, FiberCon, MiraLax, etc) 1-2 times a day regularly will usually help prevent this problem from occurring.  A mild laxative (prune juice, Milk of Magnesia, MiraLax, etc) should be taken according to package directions if there are no bowel movements after 48 hours.   5. Watch out for diarrhea.  If you have many loose bowel movements, simplify your diet to bland foods & liquids for a few days.  Stop any stool softeners and decrease your fiber supplement.  Switching to mild anti-diarrheal medications (Kayopectate, Pepto Bismol) can help.  If this worsens or does not improve, please call us.[place]. Wash / shower every day.  You may shower over the dressings as they are waterproof.  Continue to shower over incision(s) after the dressing is off. 7. Remove your waterproof bandages 5 days after surgery.  You may leave the incision open to air.  You may replace a dressing/Band-Aid to cover the incision for comfort if you wish.  8. ACTIVITIES as tolerated:   a. You may resume regular (light) daily activities beginning the next day-such as daily self-care, walking, climbing stairs-gradually increasing activities as tolerated.  If you can walk 30 minutes without difficulty, it  is safe to try more intense activity such as jogging, treadmill, bicycling, low-impact aerobics, swimming, etc. b. Save the most intensive and strenuous activity for last such as sit-ups, heavy lifting, contact sports, etc  Refrain from any heavy lifting or straining until you are off narcotics for pain control.   c. DO NOT PUSH THROUGH PAIN.  Let pain be your guide: If it hurts to do something, don't do  it.  Pain is your body warning you to avoid that activity for another week until the pain goes down. d. You may drive when you are no longer taking prescription pain medication, you can comfortably wear a seatbelt, and you can safely maneuver your car and apply brakes. e. Bonita Quin may have sexual intercourse when it is comfortable.  9. FOLLOW UP in our office a. Please call [facility] at [phone] to set up an appointment to see your surgeon in the office for a follow-up appointment approximately 2-3 weeks after your surgery. b. Make sure that you call for this appointment the day you arrive home to insure a convenient appointment time. 10. IF YOU HAVE DISABILITY OR FAMILY LEAVE FORMS, BRING THEM TO THE OFFICE FOR PROCESSING.  DO NOT GIVE THEM TO YOUR DOCTOR.   WHEN TO CALL us [phone]: 1. Poor pain control 2. Reactions / problems with new medications (rash/itching, nausea, etc)  3. Fever over 101.5 F (38.5 C) 4. Inability to urinate 5. Nausea and/or vomiting 6. Worsening swelling or bruising 7. Continued bleeding from incision. 8. Increased pain, redness, or drainage from the incision   The clinic staff is available to answer your questions during regular business hours (8:30am-5pm).  Please don't hesitate to call and ask to speak to one of our nurses for clinical concerns.   If you have a medical emergency, go to the nearest emergency room or call 911.  A surgeon from [facility] is always on call at the [facility], [place] [address], [address], [place], [place]  [address] ? MAIN: [phone] ? TOLL FREE: [phone] ?  FAX [phone] [email or web address]  Managing Pain  Pain after surgery or related to activity is often due to strain/injury to muscle, tendon, nerves and/or incisions.  This pain is usually short-term and will improve in a few months.   Many people find it helpful to do the following things TOGETHER to  help speed the process of healing and to get back to regular activity more quickly:  1. Avoid heavy physical activity a.  no lifting greater than 20 pounds b. Do not "push through" the pain.  Listen to your body and avoid positions and maneuvers than reproduce the pain c. Walking is okay as tolerated, but go slowly and stop when getting sore.  d. Remember: If it hurts to do it, then don't do it! 2. Take Anti-inflammatory medication  a. Take with food/snack around the clock for 1-2 weeks i. This helps the muscle and nerve tissues become less irritable and calm down faster b. Choose ONE of the following over-the-counter medications: i. Naproxen  tabs (ex. Aleve) 1-2 pills twice a day  ii. Ibuprofen  tabs (ex. Advil, Motrin) 3-4 pills with every meal and just before bedtime iii. Acetaminophen  tabs (Tylenol) 1-2 pills with every meal and just before bedtime 3. Use a Heating pad or Ice/Cold Pack a. 4-6 times a day b. May use warm bath/hottub  or showers 4. Try Gentle Massage and/or Stretching  a.  at the area of pain many times a day b. stop if you feel pain - do not overdo it  Try these steps together to help you body heal faster and avoid making things get worse.  Doing just one of these things may not be enough.    If you are not getting better after two weeks or are noticing you are getting worse, contact our office for further advice; we may need to re-evaluate you & see what other things we can do to help.  Lactose-Free Diet Lactose is a carbohydrate that is found mainly in milk and milk products, as well as in foods with added milk or whey. Lactose must be digested by the enzyme lactase in order to be used by the body. Lactose intolerance occurs when there is a shortage of lactase. When your body is not able to digest lactose, you may feel sick to your stomach (nausea), bloated, and have cramps, gas, and diarrhea. TYPES OF LACTASE DEFICIENCY  Primary lactase deficiency.  This is the most common type. It is characterized by a slow decrease in lactase activity.  Secondary lactase deficiency. This occurs following injury to the small intestinal mucosa as a result of a disease or condition. It can also occur as a result of surgery or after treatment with antibiotic medicines or cancer drugs. Tolerance to lactose varies widely. Each person must determine how much milk can be consumed without developing symptoms. Drinking smaller portions of milk throughout the day may be helpful. Some studies suggest that slowing gastric emptying may help increase tolerance of milk products. This may be done by:  Consuming milk or milk products with a meal rather than alone.  Consuming milk with a higher fat content. There are many dairy products that may be tolerated better than milk by some people, including:  Cheese (especially aged cheese). The lactose content is much lower than in milk.  Cultured dairy products, such as yogurt, buttermilk, cottage cheese, and sweet acidophilus milk (kefir). These products are usually well tolerated by lactase-deficient people. This is because the healthy bacteria help digest lactose.  Lactose-hydrolyzed milk. This product contains 40% to 90% less lactose than milk and may also be well tolerated. ADEQUACY These diets may be deficient in calcium, riboflavin, and vitamin D, according to the Recommended Dietary Allowances of the [employer]. Depending on individual tolerances and the use of milk substitutes, milk, or other dairy products, you may be able to meet these recommendations. SPECIAL NOTES  Lactose is a carbohydrate. The main food source for lactose is dairy products. Reading food labels is important. Many products contain lactose even when they are not made from milk. Look for the following words: whey, milk solids, dry milk solids, nonfat dry milk powder. Typical sources of lactose other than dairy products include breads,  candies, cold cuts, prepared and processed foods, and commercial sauces and gravies.  All foods must be prepared without milk, cream, or other dairy foods.  A vitamin or mineral supplement may be necessary. Consult your caregiver or Registered Dietitian.  Lactose is also found in many prescription and over-the-counter medicines.  Soy milk and lactose-free supplements may be used as an alternative to milk. CHOOSING FOODS Breads and Starches  Allowed: Breads and rolls made without milk. [place], [place], or [place] bread. Soda crackers, graham crackers. Any crackers prepared without lactose. Cooked or dry cereals prepared without lactose (read labels). Any potatoes, pasta, or rice prepared without milk or lactose. Popcorn.  Avoid: Breads and  rolls that contain milk. Prepared mixes such as muffins, biscuits, waffles, pancakes. Sweet rolls, donuts, [place] toast (if made with milk or lactose). Zwieback crackers, corn curls, or any crackers that contain lactose. Cooked or dry cereals prepared with lactose (read labels). Instant potatoes, frozen [place] fries, scalloped or au gratin potatoes. Vegetables  Allowed: Fresh, frozen, and canned vegetables.  Avoid: Creamed or breaded vegetables. Vegetables in a cheese sauce or with lactose-containing margarines. Fruit  Allowed: All fresh, canned, or frozen fruits that are not processed with lactose.  Avoid: Any canned or frozen fruits processed with lactose. Meat and Meat Substitutes  Allowed: Plain beef, chicken, fish, [place], lamb, veal, pork, or ham. Kosher prepared meat products. Strained or junior meats that do not contain milk. Eggs, soy meat substitutes, nuts.  Avoid: Scrambled eggs, omelets, and souffles that contain milk. Creamed or breaded meat, fish, or fowl. Sausage products such as wieners, liver sausage, or cold cuts that contain milk solids. Cheese, cottage cheese, or cheese spreads. Milk  Allowed: None.  Avoid: Milk (whole, 2%, skim,  or chocolate). Evaporated, powdered, or condensed milk. Malted milk. Soups and Combination Foods  Allowed: Bouillon, broth, vegetable soups, clear soups, consomms. Homemade soups made with allowed ingredients. Combination or prepared foods that do not contain milk or milk products (read labels).  Avoid: Cream soups, chowders, commercially prepared soups containing lactose. Macaroni and cheese, pizza. Combination or prepared foods that contain milk or milk products. Desserts and Sweets  Allowed: Water and fruit ices, gelatin, angel food cake. Homemade cookies, pies, or cakes made from allowed ingredients. Pudding (if made with water or a milk substitute). Lactose-free tofu desserts. Sugar, honey, corn syrup, jam, jelly, marmalade, molasses (beet sugar). Pure sugar candy, marshmallows.  Avoid: Ice cream, ice milk, sherbet, custard, pudding, frozen yogurt. Commercial cake and cookie mixes. Desserts that contain chocolate. Pie crust made with milk-containing margarine. Reduced calorie desserts made with a sugar substitute that contains lactose. Toffee, peppermint, butterscotch, chocolate, caramels. Fats and Oils  Allowed: Butter (as tolerated, contains very small amounts of lactose). Margarines and dressings that do not contain milk. Vegetable oils, shortening, mayonnaise, nondairy cream and whipped toppings without lactose or milk solids added. Tomasa Blase.  Avoid: Margarines and salad dressings containing milk. Cream, cream cheese, peanut butter with added milk solids, sour cream, chip dips made with sour cream. Beverages  Allowed: Carbonated drinks, tea, coffee and freeze-dried coffee, some instant coffees (check labels). Fruit drinks, fruit and vegetable juice, rice or soy milk.  Avoid: Hot chocolate. Some cocoas, some instant coffees, instant iced teas, powdered fruit drinks (read labels). Condiments  Allowed: Soy sauce, carob powder, olives, gravy made with water, baker's cocoa, pickles, pure  seasonings and spices, wine, pure monosodium glutamate, catsup, mustard.  Avoid: Some chewing gums, chocolate, some cocoas. Certain antibiotics and vitamin or mineral preparations. Spice blends if they contain milk products. MSG extender. Artificial sweeteners that contain lactose. Some nondairy creamers (read labels). SAMPLE MENU Breakfast  Orange juice.  Banana.  Bran cereal.  Nondairy creamer.  Vienna bread, toasted.  Butter or milk-free margarine.  Coffee or tea. Lunch  Chicken breast.  Rice.  Green beans.  Butter or milk-free margarine.  Fresh melon.  Coffee or tea. Dinner  [place].  Baked potato.  Butter or milk-free margarine.  Broccoli.  Lettuce salad with vinegar and oil dressing.  [employer].  Coffee or tea. Document Released: 06/02/2002 Document Revised: 03/04/2012 Document Reviewed: 03/10/2011 [facility] Patient Information 2013 [place], [place].

## 2013-03-17 NOTE — Progress Notes (Signed)
Subjective:     Patient ID: Brittney Blevins, female   DOB: 1991-04-26, 22 y.o.   MRN: [id number]  HPI  Brittney Blevins  03/28/1991 [id number]  Patient Care Team: Thayer Headings, MD as PCP - General (Internal Medicine)  This patient is a 22 y.o.female who presents today for surgical evaluation  POST-OPERATIVE DIAGNOSIS: chronic cholecystitis  PROCEDURE: Procedure(s):  LAPAROSCOPIC CHOLECYSTECTOMY SINGLE SITE 02/28/2013  The patient comes in today in good spirits.  Underwent laparoscopic cholecystectomy two and half weeks ago.  Did not have much pain.  Eating well.  Little sensitivity to dairy with some left upper abdominal pain.  No nausea or vomiting.  No diarrhea.  No constipation.  Very happy with her results so far.  No more attacks is a relief for her.   Energy level good.  She is working with a Systems analyst.  Wishes to get back to more intense exercise but wants to make sure it is safe.  Patient Active Problem List  Diagnosis  . Obesity (BMI 30-39.9)  . Chronic cholecystitis with calculus    Past Medical History  Diagnosis Date  . Cholelithiasis 02-19-13    surgery planned 02-28-13    Past Surgical History  Procedure Laterality Date  . Laparoscopic cholecystectomy single port N/A 02/28/2013    Procedure: LAPAROSCOPIC CHOLECYSTECTOMY SINGLE SITE;  Surgeon: Ardeth Sportsman, MD;  Location: [facility];  Service: General;  Laterality: N/A;    History   Social History  . Marital Status: Single    Spouse Name: N/A    Number of Children: N/A  . Years of Education: N/A   Occupational History  . Not on file.   Social History Main Topics  . Smoking status: Never Smoker   . Smokeless tobacco: Never Used  . Alcohol Use: Yes     Comment: rare-Social  . Drug Use: No  . Sexually Active: No   Other Topics Concern  . Not on file   Social History Narrative  . No narrative on file    History reviewed. No pertinent family history.  Current Outpatient Prescriptions   Medication Sig Dispense Refill  . oxyCODONE (OXY IR/ROXICODONE) 5 MG immediate release tablet Take 1-2 tablets (5-10 mg total) by mouth every 4 (four) hours as needed for pain.  50 tablet  0  . phentermine (ADIPEX-P) 37.5 MG tablet Take 37.5 mg by mouth daily before breakfast.       No current facility-administered medications for this visit.     No Known Allergies  BP 110/64  Pulse 70  Resp 18  Ht 5\' 6"  (1.676 m)  Wt 219 lb (99.338 kg)  BMI 35.36 kg/m2  LMP 02/01/2013  No results found.   Review of Systems  Constitutional: Negative for fever, chills and diaphoresis.  HENT: Negative for ear pain, sore throat and trouble swallowing.   Eyes: Negative for photophobia and visual disturbance.  Respiratory: Negative for cough and choking.   Cardiovascular: Negative for chest pain and palpitations.  Gastrointestinal: Negative for nausea, vomiting, abdominal pain, diarrhea, constipation, blood in stool, anal bleeding and rectal pain.  Genitourinary: Negative for dysuria, frequency and difficulty urinating.  Musculoskeletal: Negative for myalgias and gait problem.  Skin: Negative for color change, pallor and rash.  Neurological: Negative for dizziness, speech difficulty, weakness and numbness.  Hematological: Negative for adenopathy.  Psychiatric/Behavioral: Negative for confusion and agitation. The patient is not nervous/anxious.        Objective:   Physical Exam  Constitutional: She is  oriented to person, place, and time. She appears well-developed and well-nourished. No distress.  HENT:  Head: Normocephalic.  Mouth/Throat: Oropharynx is clear and moist. No oropharyngeal exudate.  Eyes: Conjunctivae and EOM are normal. Pupils are equal, round, and reactive to light. No scleral icterus.  Neck: Normal range of motion. No tracheal deviation present.  Cardiovascular: Normal rate and intact distal pulses.   Pulmonary/Chest: Effort normal. No respiratory distress. She exhibits no  tenderness.  Abdominal: Soft. She exhibits no distension. There is no tenderness. Hernia confirmed negative in the right inguinal area and confirmed negative in the left inguinal area.  Incision supraumbilical clean with normal healing ridges.  No hernias  Genitourinary: No vaginal discharge found.  Musculoskeletal: Normal range of motion. She exhibits no tenderness.  Lymphadenopathy:       Right: No inguinal adenopathy present.       Left: No inguinal adenopathy present.  Neurological: She is alert and oriented to person, place, and time. No cranial nerve deficit. She exhibits normal muscle tone. Coordination normal.  Skin: Skin is warm and dry. No rash noted. She is not diaphoretic.  Psychiatric: She has a normal mood and affect. Her behavior is normal.       Assessment:     Chronic cholecystitis status post cholecystectomy, recovering well.     Plan:     Increase activity as tolerated to regular activity.  Do not push through pain.  Start with low impact activity and gradually increased unrestricted activity over the next few weeks.  Diet as tolerated. Bowel regimen to avoid problems.  Consider lactose free diet for the next few weeks and then gradually add it back in if she has had tolerance of it in the past.  Return to clinic p.r.n.   Instructions discussed.  Followup with primary care physician for other health issues as would normally be done.  Questions answered.  The patient expressed understanding and appreciation

## 2013-05-14 ENCOUNTER — Encounter (INDEPENDENT_AMBULATORY_CARE_PROVIDER_SITE_OTHER): Payer: Self-pay

## 2013-09-16 ENCOUNTER — Ambulatory Visit (INDEPENDENT_AMBULATORY_CARE_PROVIDER_SITE_OTHER): Payer: BC Managed Care – PPO | Admitting: Physician Assistant

## 2013-09-16 VITALS — BP 116/78 | HR 80 | Temp 98.3°F | Resp 16 | Ht 66.5 in | Wt 236.6 lb

## 2013-09-16 DIAGNOSIS — Z2089 Contact with and (suspected) exposure to other communicable diseases: Secondary | ICD-10-CM

## 2013-09-16 DIAGNOSIS — Z207 Contact with and (suspected) exposure to pediculosis, acariasis and other infestations: Secondary | ICD-10-CM

## 2013-09-16 MED ORDER — PERMETHRIN 5 % EX CREA: TOPICAL_CREAM | Freq: Once | CUTANEOUS | Status: DC

## 2013-09-16 NOTE — Patient Instructions (Addendum)
Use the permethrin cream once.  Apply before bedtime, then wash off 8-12 hours later.  That will kill any potential scabies.  The itching may persist for 1-2 weeks though as your skin heals.  Take Zyrtec/Claritin/Allegra in the morning and Benadryl at bedtime to help with the itching   Scabies Scabies are small bugs (mites) that burrow under the skin and cause red bumps and severe itching. These bugs can only be seen with a microscope. Scabies are highly contagious. They can spread easily from person to person by direct contact. They are also spread through sharing clothing or linens that have the scabies mites living in them. It is not unusual for an entire family to become infected through shared towels, clothing, or bedding.  HOME CARE INSTRUCTIONS   Your caregiver may prescribe a cream or lotion to kill the mites. If cream is prescribed, massage the cream into the entire body from the neck to the bottom of both feet. Also massage the cream into the scalp and face if your child is less than 70 year old. Avoid the eyes and mouth. Do not wash your hands after application.  Leave the cream on for 8 to 12 hours. Your child should bathe or shower after the 8 to 12 hour application period. Sometimes it is helpful to apply the cream to your child right before bedtime.  One treatment is usually effective and will eliminate approximately 95% of infestations. For severe cases, your caregiver may decide to repeat the treatment in 1 week. Everyone in your household should be treated with one application of the cream.  New rashes or burrows should not appear within 24 to 48 hours after successful treatment. However, the itching and rash may last for 2 to 4 weeks after successful treatment. Your caregiver may prescribe a medicine to help with the itching or to help the rash go away more quickly.  Scabies can live on clothing or linens for up to 3 days. All of your child's recently used clothing, towels, stuffed  toys, and bed linens should be washed in hot water and then dried in a dryer for at least 20 minutes on high heat. Items that cannot be washed should be enclosed in a plastic bag for at least 3 days.  To help relieve itching, bathe your child in a cool bath or apply cool washcloths to the affected areas.  Your child may return to school after treatment with the prescribed cream. SEEK MEDICAL CARE IF:   The itching persists longer than 4 weeks after treatment.  The rash spreads or becomes infected. Signs of infection include red blisters or yellow-tan crust. Document Released: 12/11/2005 Document Revised: 03/04/2012 Document Reviewed: 04/21/2009 [facility] Patient Information 2014 [place], [place].

## 2013-09-16 NOTE — Progress Notes (Signed)
  Subjective:    Patient ID: Brittney Blevins, female    DOB: [birth year], 22 y.o.   MRN: [id number]  HPI   Brittney Blevins is a very pleasant 22 yr old female here with concern for scabies.  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  Pt admits that after googling her scabies, she does not think she has it, but has been concerned nonetheless.  Reports she as noted bumps on her skin occasionally over the last month - appear in random places, sometimes itchy, resolve spontaneously.  Generalized itching for the last week - though not intense, and not constant.  She denies rash.  No household contacts with symptoms.  Otherwise feels well today.     Review of Systems  Constitutional: Negative for fever and chills.  HENT: Negative.   Respiratory: Negative.   Cardiovascular: Negative.   Gastrointestinal: Negative.   Musculoskeletal: Negative.   Skin:       Itching  Neurological: Negative.        Objective:   Physical Exam  Vitals reviewed. Constitutional: She is oriented to person, place, and time. She appears well-developed and well-nourished. No distress.  HENT:  Head: Normocephalic and atraumatic.  Eyes: Conjunctivae are normal. No scleral icterus.  Cardiovascular: Normal rate, regular rhythm and normal heart sounds.   Pulmonary/Chest: Effort normal and breath sounds normal.  Neurological: She is alert and oriented to person, place, and time.  Skin: Skin is warm and dry. No rash noted.  Psychiatric: She has a normal mood and affect. Her behavior is normal.        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  Pt history and exam are not concerning for scabies.  I think it is reasonable to treat pruritus with antihistamines and monitor symptoms, but  pt would feel more comfortable treating empirically for scabies, which I do not think is unreasonable.  Permethrin cream x 1.  Zyrtec and Benadryl for itching.  RTC if worsening or not improving.

## 2013-12-09 IMAGING — US US ABDOMEN LIMITED
1 series · 14 of 25 positions shown · non-contrast
Comparison: None.

CLINICAL DATA: Intermittent epigastric pain.

LIMITED ABDOMINAL ULTRASOUND - RIGHT UPPER QUADRANT

[Series 1: us abdomen limited · 0.22mm/px · 14 of 43 slices shown]
[im 1/43]
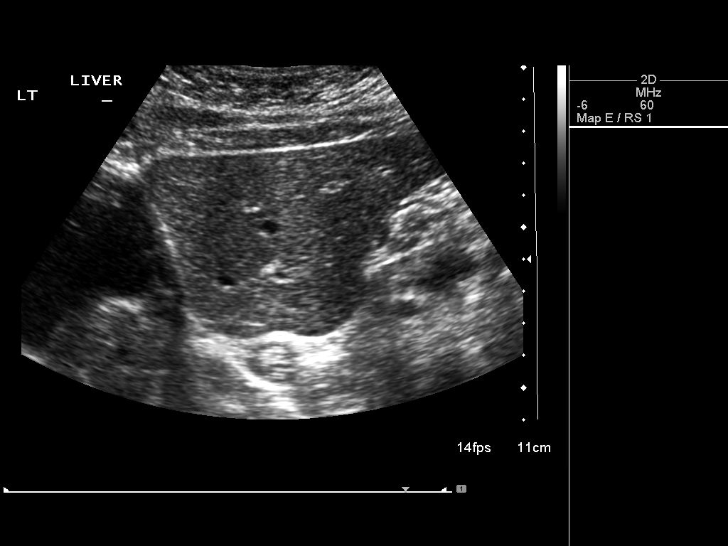
[im 4/43]
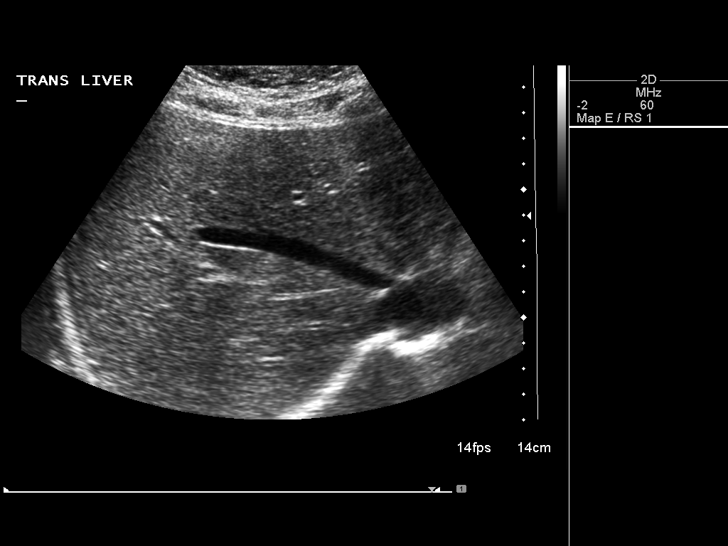
[im 8/43]
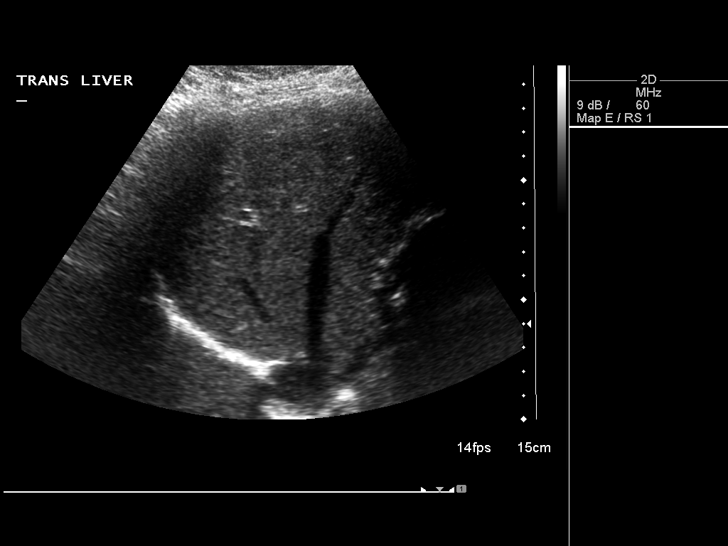
[im 11/43]
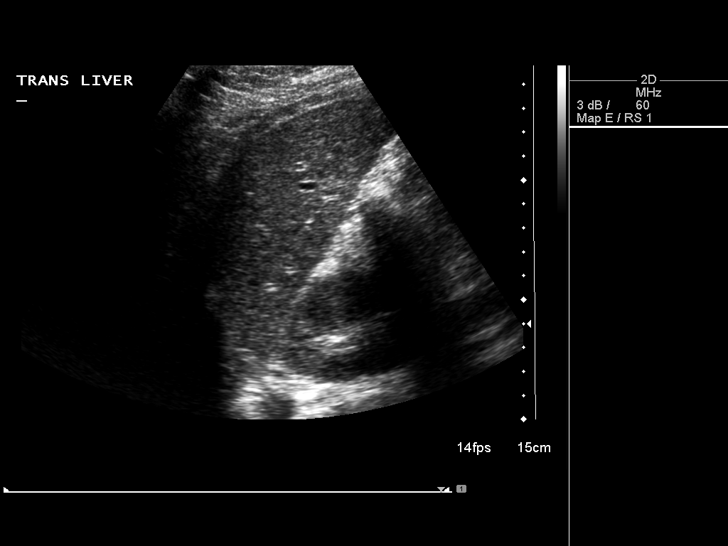
[im 15/43]
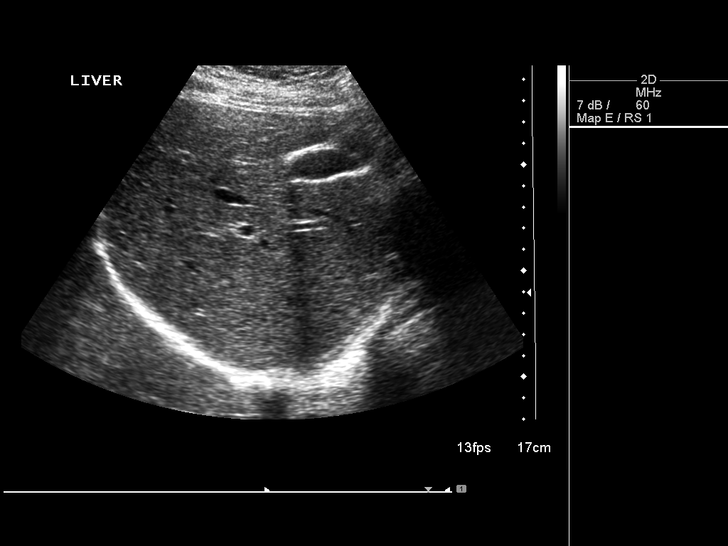
[im 16/43]
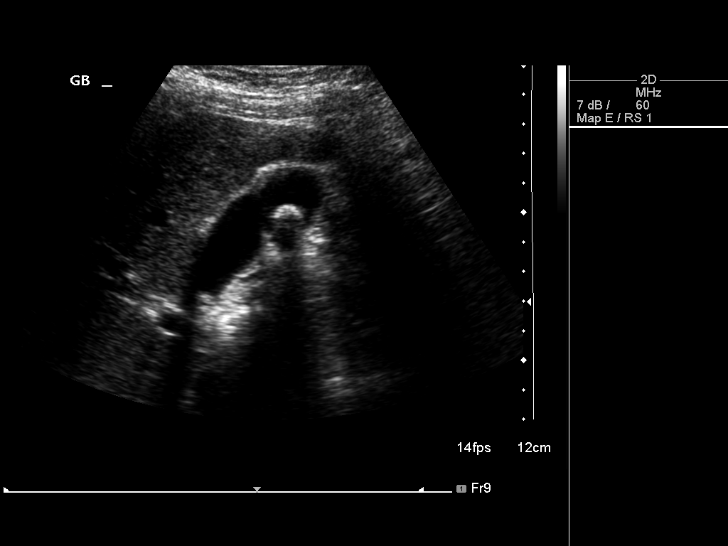
[im 20/43]
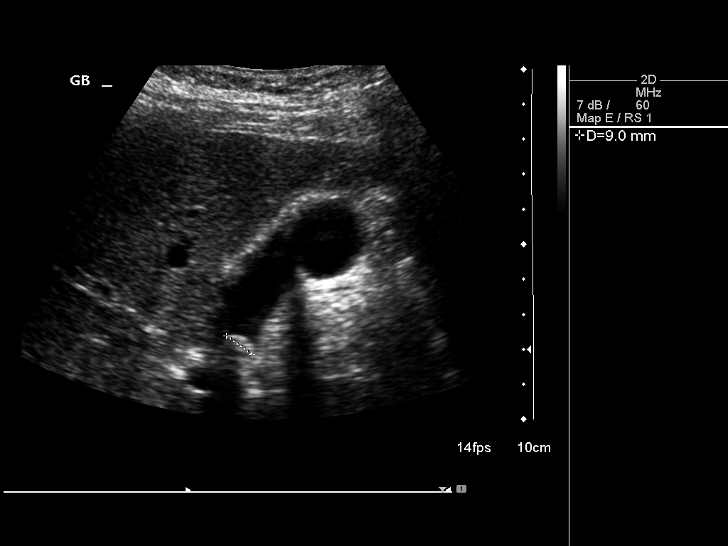
[im 23/43]
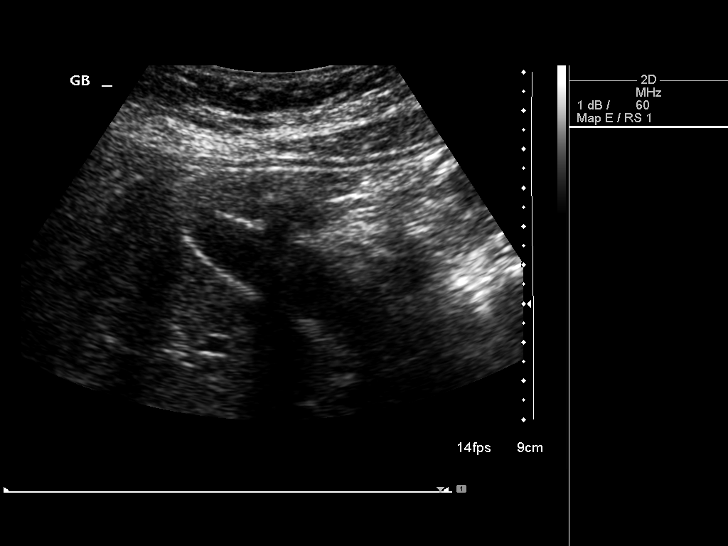
[im 27/43]
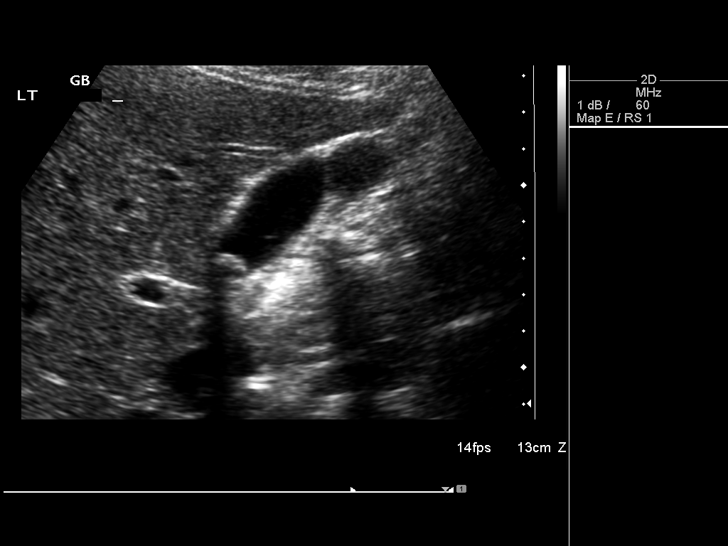
[im 29/43]
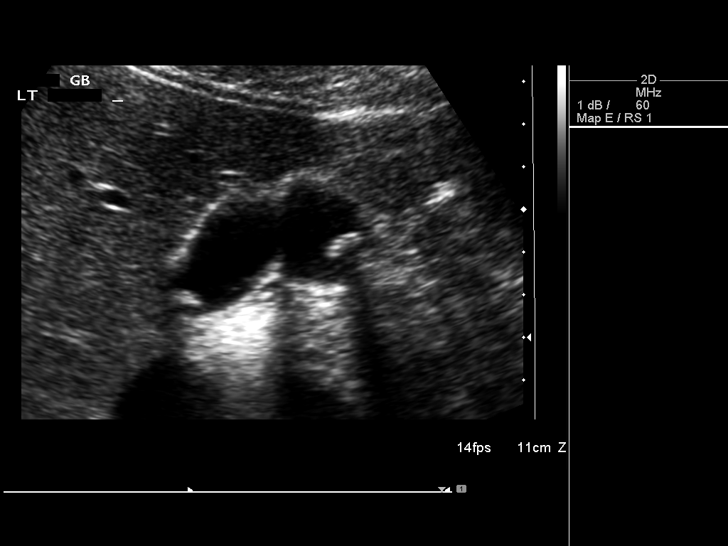
[im 32/43]
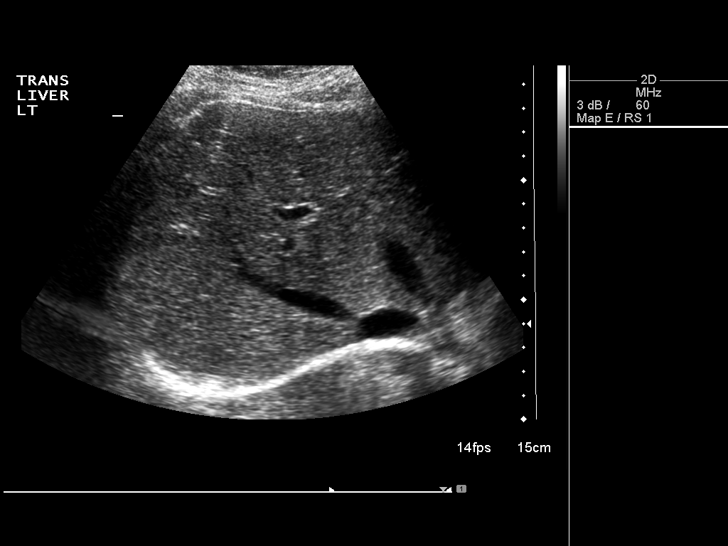
[im 36/43]
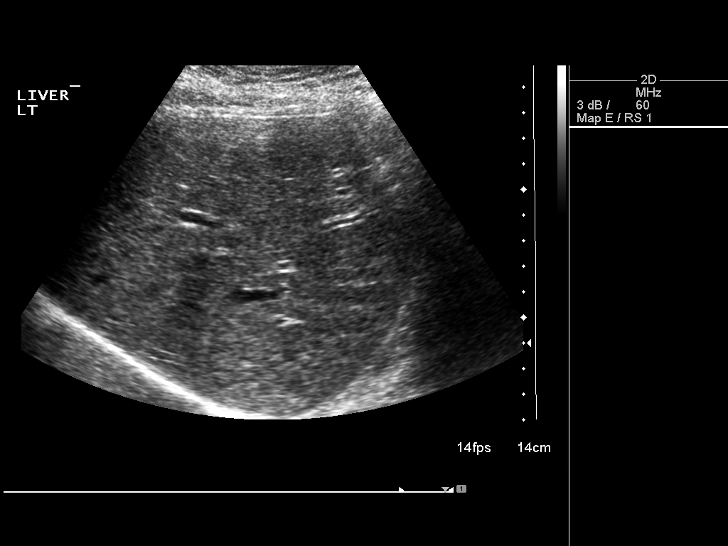
[im 39/43]
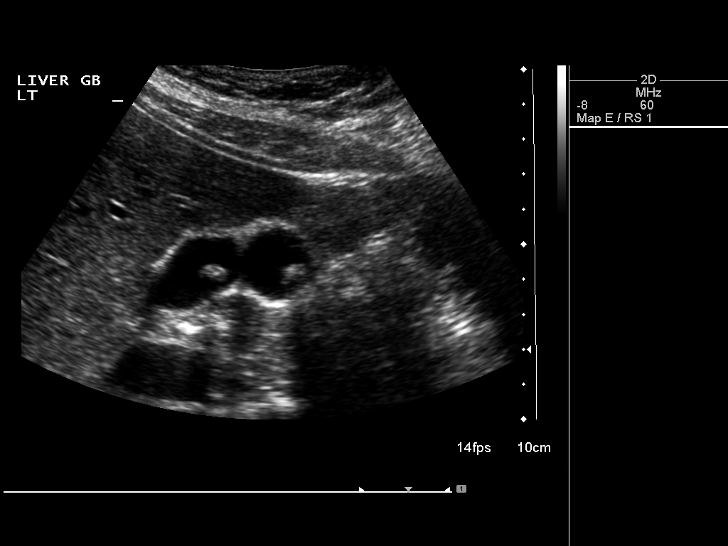
[im 43/43]
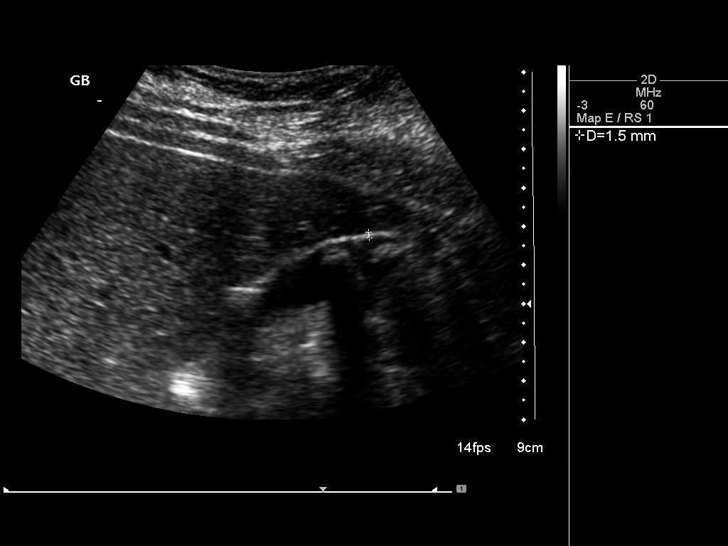

[14 of 25 positions shown; findings below may reference images not displayed]

FINDINGS: Gallbladder:  There are two mobile echogenic gallstones.  The
largest stone measures 1 cm in size.  Gallstones demonstrate
posterior acoustic shadowing.  There is no evidence for gallbladder
wall thickening or a sonographic Murphy's sign.

Common bile duct:  Measures 0.4 cm.

Liver:  Normal appearance of the liver parenchyma.  No focal
abnormality.  Main portal vein is patent.
IMPRESSION: Cholelithiasis.

## 2014-01-26 ENCOUNTER — Other Ambulatory Visit: Payer: Self-pay

## 2014-01-26 ENCOUNTER — Other Ambulatory Visit (HOSPITAL_COMMUNITY)
Admission: RE | Admit: 2014-01-26 | Discharge: 2014-01-26 | Disposition: A | Discharge status: Home / Self Care | Payer: Federal, State, Local not specified - PPO | Source: Ambulatory Visit | Attending: Internal Medicine | Admitting: Internal Medicine

## 2014-01-26 DIAGNOSIS — Z01419 Encounter for gynecological examination (general) (routine) without abnormal findings: Secondary | ICD-10-CM | POA: Insufficient documentation

## 2017-12-26 DIAGNOSIS — H5213 Myopia, bilateral: Secondary | ICD-10-CM | POA: Diagnosis not present

## 2017-12-26 DIAGNOSIS — H52222 Regular astigmatism, left eye: Secondary | ICD-10-CM | POA: Diagnosis not present

## 2018-04-22 DIAGNOSIS — J069 Acute upper respiratory infection, unspecified: Secondary | ICD-10-CM | POA: Diagnosis not present

## 2018-04-22 MED FILL — PROMETHAZINE-DM SYRUP: 6.25-15 | 4 days supply | Qty: 120 | Fill #0

## 2018-04-22 MED FILL — POLYMYXIN B-TMP EYE DROPS: 10000-0.1 | 7 days supply | Qty: 10 | Fill #0

## 2018-06-19 NOTE — Progress Notes (Addendum)
Patient: Brittney Blevins MRN: [id number] DOB: [birth year] PCP: Orland Mustard, MD     Subjective:  Chief Complaint  Patient presents with  . Establish Care    requesting cpe    HPI: The patient is a 27 y.o. female who presents today for annual exam. She denies any changes to past medical history. There have been no recent hospitalizations. They are not following a well balanced diet and exercise plan. Weight has been fluctuating a bit. No complaints today. She has reached out to a trainer to start training. She has not been sexually active since 2016 and was screened for std's. No sexual partners since that time. No fh of colon or breast cancer.   Immunization History  Administered Date(s) Administered  . PPD Test 07/24/2017   Pap smear: 2017 and normal. Will get records.  Tdap: utd per patient.   Review of Systems  Constitutional: Negative for activity change and appetite change.  HENT: Negative.   Respiratory: Negative for shortness of breath.   Cardiovascular: Negative for chest pain and leg swelling.  Gastrointestinal: Negative for diarrhea, nausea and vomiting.  Genitourinary: Negative.   Musculoskeletal: Negative for back pain, joint swelling and neck pain.  Neurological: Negative for dizziness and headaches.  Psychiatric/Behavioral: The patient is not nervous/anxious.     Allergies Patient has No Known Allergies.  Past Medical History Patient  has a past medical history of Cholelithiasis (02-19-13), Chronic cholecystitis with calculus (01/15/2013), Frequent headaches, and Heart murmur.  Surgical History Patient  has a past surgical history that includes Laparoscopic cholecystectomy single port (N/A, 02/28/2013) and Cholecystectomy.  Family History Pateint's family history includes Asthma in her mother; Diabetes in her paternal grandmother; Heart attack in her paternal grandfather; Miscarriages / [place] in her mother; Stroke in her paternal grandmother.  Social  History Patient  reports that she has never smoked. She has never used smokeless tobacco. She reports that she drinks alcohol. She reports that she does not use drugs.    Objective: Vitals:   06/20/18 0904  BP: 100/68  Pulse: 70  Temp: 98 F (36.7 C)  TempSrc: Oral  SpO2: 99%  Weight: 277 lb 3.2 oz (125.7 kg)  Height: 5\' 6"  (1.676 m)    Body mass index is 44.74 kg/m.  Physical Exam  Constitutional: She is oriented to person, place, and time. She appears well-developed and well-nourished.  HENT:  Right Ear: External ear normal.  Left Ear: External ear normal.  Mouth/Throat: Oropharynx is clear and moist.  TM pearly with light reflex bilaterally   Eyes: Pupils are equal, round, and reactive to light. Conjunctivae and EOM are normal.  Neck: Normal range of motion. Neck supple. No thyromegaly present.  Cardiovascular: Normal rate, regular rhythm and intact distal pulses.  No murmur heard. Irregular rhythm  Pulmonary/Chest: Effort normal and breath sounds normal.  Abdominal: Soft. Bowel sounds are normal. She exhibits no distension. There is no tenderness.  Lymphadenopathy:    She has no cervical adenopathy.  Neurological: She is alert and oriented to person, place, and time. She displays normal reflexes. No cranial nerve deficit. Coordination normal.  Skin: Skin is warm and dry. No rash noted.  Psychiatric: She has a normal mood and affect. Her behavior is normal.  Vitals reviewed.      Assessment/plan: 1. Annual physical exam Doing great. Looking into a personal trainer and exercise routine to lose weight. Work on diet. Sees dentist regularly. F/u in one year or as needed. Requesting records for pap and  tdap.   Patient counseling [x]    Nutrition: Stressed importance of moderation in sodium/caffeine intake, saturated fat and cholesterol, caloric balance, sufficient intake of fresh fruits, vegetables, fiber, calcium, iron, and 1 mg of folate supplement per day (for females  capable of pregnancy).  [x]    Stressed the importance of regular exercise.   [x]    Substance Abuse: Discussed cessation/primary prevention of tobacco, alcohol, or other drug use; driving or other dangerous activities under the influence; availability of treatment for abuse.   [x]    Injury prevention: Discussed safety belts, safety helmets, smoke detector, smoking near bedding or upholstery.   [x]    Sexuality: Discussed sexually transmitted diseases, partner selection, use of condoms, avoidance of unintended pregnancy  and contraceptive alternatives.  [x]    Dental health: Discussed importance of regular tooth brushing, flossing, and dental visits.  [x]    Health maintenance and immunizations reviewed. Please refer to Health maintenance section.    - CBC with Differential/Platelet - Comprehensive metabolic panel - Lipid panel - TSH  2. Encounter for screening for HIV  - HIV antibody  3. Irregular heart beat Likely PAC, but she thinks it was PVC. Work up done and released from cardiology in 2015. She is asymptomatic. Requesting records. Let me know if starts to have symptoms.    Return in about 1 year (around 06/21/2019).   Orland MustardAllison Wolfe, MD [place] [facility]  06/20/2018

## 2018-06-20 ENCOUNTER — Encounter: Payer: Self-pay | Admitting: Family Medicine

## 2018-06-20 ENCOUNTER — Ambulatory Visit (INDEPENDENT_AMBULATORY_CARE_PROVIDER_SITE_OTHER): Payer: 59 | Admitting: Family Medicine

## 2018-06-20 ENCOUNTER — Ambulatory Visit: Payer: Federal, State, Local not specified - PPO | Admitting: Family Medicine

## 2018-06-20 VITALS — BP 100/68 | HR 70 | Temp 98.0°F | Ht 66.0 in | Wt 277.2 lb

## 2018-06-20 DIAGNOSIS — Z114 Encounter for screening for human immunodeficiency virus [HIV]: Secondary | ICD-10-CM | POA: Diagnosis not present

## 2018-06-20 DIAGNOSIS — I499 Cardiac arrhythmia, unspecified: Secondary | ICD-10-CM | POA: Insufficient documentation

## 2018-06-20 DIAGNOSIS — Z0000 Encounter for general adult medical examination without abnormal findings: Secondary | ICD-10-CM | POA: Diagnosis not present

## 2018-06-20 LAB — CBC WITH DIFFERENTIAL/PLATELET
BASOS PCT: 0.3 % (ref 0.0–3.0)
Basophils Absolute: 0 10*3/uL (ref 0.0–0.1)
EOS ABS: 0.2 10*3/uL (ref 0.0–0.7)
Eosinophils Relative: 2.4 % (ref 0.0–5.0)
HEMATOCRIT: 35.9 % — AB (ref 36.0–46.0)
Hemoglobin: 11.7 g/dL — ABNORMAL LOW (ref 12.0–15.0)
LYMPHS PCT: 28.3 % (ref 12.0–46.0)
Lymphs Abs: 2.1 10*3/uL (ref 0.7–4.0)
MCHC: 32.6 g/dL (ref 30.0–36.0)
MCV: 85.9 fl (ref 78.0–100.0)
MONOS PCT: 7.9 % (ref 3.0–12.0)
Monocytes Absolute: 0.6 10*3/uL (ref 0.1–1.0)
NEUTROS ABS: 4.6 10*3/uL (ref 1.4–7.7)
Neutrophils Relative %: 61.1 % (ref 43.0–77.0)
PLATELETS: 286 10*3/uL (ref 150.0–400.0)
RBC: 4.18 Mil/uL (ref 3.87–5.11)
RDW: 14.7 % (ref 11.5–15.5)
WBC: 7.5 10*3/uL (ref 4.0–10.5)

## 2018-06-20 LAB — COMPREHENSIVE METABOLIC PANEL
ALT: 14 U/L (ref 0–35)
AST: 15 U/L (ref 0–37)
Albumin: 4.4 g/dL (ref 3.5–5.2)
Alkaline Phosphatase: 52 U/L (ref 39–117)
BUN: 12 mg/dL (ref 6–23)
CALCIUM: 9.7 mg/dL (ref 8.4–10.5)
CO2: 28 meq/L (ref 19–32)
Chloride: 105 mEq/L (ref 96–112)
Creatinine, Ser: 0.72 mg/dL (ref 0.40–1.20)
GFR: 124.1 mL/min (ref >60.00)
Glucose, Bld: 90 mg/dL (ref 70–99)
Potassium: 4 mEq/L (ref 3.5–5.1)
Sodium: 139 mEq/L (ref 135–145)
Total Bilirubin: 0.2 mg/dL (ref 0.2–1.2)
Total Protein: 7.4 g/dL (ref 6.0–8.3)

## 2018-06-20 LAB — LIPID PANEL
CHOL/HDL RATIO: 3
Cholesterol: 182 mg/dL (ref 0–200)
HDL: 68.3 mg/dL (ref >39.00)
LDL CALC: 96 mg/dL (ref 0–99)
NonHDL: 113.59
TRIGLYCERIDES: 87 mg/dL (ref 0.0–149.0)
VLDL: 17.4 mg/dL (ref 0.0–40.0)

## 2018-06-20 LAB — TSH: TSH: 3.34 u[IU]/mL (ref 0.35–4.50)

## 2018-06-20 NOTE — Patient Instructions (Signed)
Health Maintenance Due  Topic Date Due  . HIV Screening  07/14/2005  . TETANUS/TDAP  07/14/2009  . PAP SMEAR  01/26/2017

## 2018-06-21 LAB — HIV ANTIBODY (ROUTINE TESTING W REFLEX): HIV: NONREACTIVE

## 2018-07-04 ENCOUNTER — Telehealth: Payer: Self-pay

## 2018-07-04 ENCOUNTER — Encounter: Payer: Self-pay | Admitting: Family Medicine

## 2018-07-04 ENCOUNTER — Telehealth: Payer: Self-pay | Admitting: Family Medicine

## 2018-07-04 DIAGNOSIS — E282 Polycystic ovarian syndrome: Secondary | ICD-10-CM | POA: Insufficient documentation

## 2018-07-04 NOTE — Telephone Encounter (Signed)
Patient called back, does not have gyn, does not take victoza/metformin, was taking for PCOS.ok to release per CRM

## 2018-07-04 NOTE — Telephone Encounter (Signed)
Called pt's cell phone and left voicemail msg to return my call

## 2018-07-04 NOTE — Telephone Encounter (Signed)
Spoke to Brittney Blevins and advised Dr. Artis FlockWolfe can do her pap smear/gyn exam.  Brittney Blevins in agreement and will call back to schedule.

## 2018-07-04 NOTE — Telephone Encounter (Signed)
I can do her pap smear for her if she wants to set that up. No rush. Thanks for letting us  Know about medication.

## 2018-07-04 NOTE — Telephone Encounter (Signed)
See note

## 2018-07-04 NOTE — Telephone Encounter (Signed)
Please see note.

## 2018-07-04 NOTE — Telephone Encounter (Signed)
Please let her know I got her records. Looks like she is due for a pap smear and also wanted to ask her about the victoza/metformin that her other doctor had her on??? Was this for weight loss? She  Is not diabetic.

## 2018-11-11 ENCOUNTER — Telehealth: Payer: Self-pay | Admitting: Family Medicine

## 2018-11-11 NOTE — Telephone Encounter (Signed)
Copied from CRM [phone] [phone]. Topic: General - Other >> Nov 11, 2018  3:52 PM Marylen PontoMcneil, Ja-Kwan wrote: Reason for CRM: Pt states she needs to speak with Dr Blair HeysWolfe's nurse. Pt did not want to go in to detail but just requested a call back from the nurse. Cb# [phone]

## 2018-11-12 ENCOUNTER — Ambulatory Visit: Payer: Self-pay

## 2018-11-13 ENCOUNTER — Ambulatory Visit: Payer: Self-pay | Admitting: *Deleted

## 2018-11-13 NOTE — Telephone Encounter (Signed)
Attempted to call patient regarding message left from her to speak with Dr. Blair HeysWolfe's nurse. No answer, left message for the patient to call back.

## 2018-11-13 NOTE — Telephone Encounter (Signed)
Noted.  I've already spoken with patient and aware that she has appt scheduled for 11/21 at 8:40 am.

## 2018-11-13 NOTE — Telephone Encounter (Signed)
See note

## 2018-11-13 NOTE — Telephone Encounter (Signed)
Pt called to request an appointment, for c/o lump on the right breast, inner side. She denies redness, rash, or nipple discharge. She has had a lump noted before by a previous provider and benign per pt. No fever. Appointment scheduled per protocol.  Reason for Disposition . Breast lump  Answer Assessment - Initial Assessment Questions 1. SYMPTOM: "What's the main symptom you're concerned about?"  (e.g., lump, pain, rash, nipple discharge)     Lump  2. LOCATION: "Where is the lump located?"     Inner area of right breast. 3. ONSET: "When did lump  start?"     Last week 4. PRIOR HISTORY: "Do you have any history of prior problems with your breasts?" (e.g., lumps, cancer, fibrocystic breast disease)     Lumps in the past and was checked out by the provider 5. CAUSE: "What do you think is causing this symptom?"     no 6. OTHER SYMPTOMS: "Do you have any other symptoms?" (e.g., fever, breast pain, redness or rash, nipple discharge)     no 7. PREGNANCY-BREASTFEEDING: "Is there any chance you are pregnant?" "When was your last menstrual period?" "Are you breastfeeding?"     No, LMP the first of November. No children  Protocols used: BREAST [facility]

## 2018-11-13 NOTE — Telephone Encounter (Signed)
Called and spoke with patient.  She has recently detected a lump in her right breast that she was concerned about.  [facility] and they scheduled an appt for her tomorrow 11/21 with Dr. Artis FlockWolfe to be evaluated.

## 2018-11-14 ENCOUNTER — Ambulatory Visit (INDEPENDENT_AMBULATORY_CARE_PROVIDER_SITE_OTHER): Payer: 59 | Admitting: Family Medicine

## 2018-11-14 ENCOUNTER — Encounter: Payer: Self-pay | Admitting: Family Medicine

## 2018-11-14 VITALS — BP 116/82 | HR 81 | Temp 98.0°F | Ht 66.0 in | Wt 286.2 lb

## 2018-11-14 DIAGNOSIS — N6314 Unspecified lump in the right breast, lower inner quadrant: Secondary | ICD-10-CM | POA: Diagnosis not present

## 2018-11-14 NOTE — Progress Notes (Signed)
Patient: Brittney Brittney Blevins Brittney Blevins MRN: [id number] DOB: December 26, 1990 PCP: Brittney Brittney Blevins     Subjective:  Chief Complaint  Patient presents with  . lump in right breast    HPI: The patient is a 27 y.o. female who presents today for lump detected in right breast, non tender. She first noticed it last week and its under the right breast. Lump is non tender and has not grown. She is not on her period. LMP was first of November. No family history of breast cancer. She has no skin changes in area of concern and no nipple discharge/complaints. Lump seems to be mobile and not fixed. No trauma to her breast.   Review of Systems  Respiratory: Negative for shortness of breath.   Cardiovascular: Negative for chest pain.  Genitourinary:       Lump in right breast   Musculoskeletal: Negative for back pain and neck pain.    Allergies Patient has No Known Allergies.  Past Medical History Patient  has a past medical history of Cholelithiasis (02-19-13), Chronic cholecystitis with calculus (01/15/2013), Frequent headaches, and Heart murmur.  Surgical History Patient  has a past surgical history that includes Laparoscopic cholecystectomy single port (N/A, 02/28/2013) and Cholecystectomy.  Family History Pateint's family history includes Asthma in her mother; Diabetes in her paternal grandmother; Heart attack in her paternal grandfather; Misc[place]riages / [place] her mother; Stroke in her paternal grandmother.  Social History Patient  reports that she has never smoked. She has never used smokeless tobacco. She reports that she drinks alcohol. She reports that she does not use drugs.    Objective: Vitals:   11/14/18 0840  BP: 116/82  Pulse: 81  Temp: 98 F (36.7 C)  TempSrc: Oral  SpO2: 98%  Weight: 286 lb 3.2 oz (129.8 kg)  Height: 5\' 6"  (1.676 m)    Body mass index is 46.19 kg/m.  Physical Exam  Constitutional: She appears well-developed and well-nourished.  Pulmonary/Chest: Right  breast exhibits mass and tenderness. Right breast exhibits no inverted nipple, no nipple discharge and no skin change. Left breast exhibits no inverted nipple, no mass, no nipple discharge, no skin change and no tenderness.  Very small, pebble size, palpable and mobile mass at inferior medial aspect of right breast. Slightly tender     Vitals reviewed.      Assessment/plan: 1. Lump in lower inner quadrant of right breast Does not feel pathological, but will ultrasound to further investigate. Discussed once I have this back if anything suspicious will then decide if mammogram warranted. Reassured her, that does not feel suspicious.  - US Unlisted Procedure Breast; Future      Return if symptoms worsen or fail to improve.   Brittney MustardAllison Jairy Angulo, Blevins [place] [facility]   11/14/2018

## 2019-01-21 DIAGNOSIS — H5213 Myopia, bilateral: Secondary | ICD-10-CM | POA: Diagnosis not present

## 2019-05-07 ENCOUNTER — Encounter: Payer: Self-pay | Admitting: Family Medicine

## 2019-05-28 ENCOUNTER — Telehealth: Payer: Self-pay | Admitting: Family Medicine

## 2019-05-28 NOTE — Telephone Encounter (Signed)
Left voicemail message on patient's cell phone requesting a call back regarding forms that need to be filled out.

## 2019-05-28 NOTE — Telephone Encounter (Signed)
Copied from CRM [id number]. Topic: General - Inquiry >> May 28, 2019 12:36 PM Reggie Pile, NT wrote: Reason for CRM: Patient is in need of immunization records and also needs latex allergy form filled. Please advise. Call back is [phone]

## 2019-06-02 NOTE — Telephone Encounter (Signed)
Left 2nd voicemail message on patient's cell phone.  Requested call back.

## 2019-06-03 NOTE — Telephone Encounter (Signed)
Left detailed voicemail message on patient's cell phone requesting a call back.  Advised patient on message that this was 3rd attempt to reach her and that message would be closed out.  Letter mailed to patient's home address.

## 2019-06-03 NOTE — Telephone Encounter (Signed)
Pt returned call and will call to reschedule her appt with Dr. Rogers Blocker and she will bring the paperwork then to be completed / please advise

## 2019-06-10 ENCOUNTER — Telehealth: Payer: Self-pay | Admitting: Physical Therapy

## 2019-06-10 NOTE — Telephone Encounter (Signed)
Copied from [place] [id number]. Topic: General - Other >> Jun 09, 2019  4:48 PM Nils Flack wrote: Reason for CRM: pt is requesting to have tdap vaccine when she has appt on 06/23/19

## 2019-06-10 NOTE — Telephone Encounter (Signed)
Noted in chart.

## 2019-06-23 ENCOUNTER — Ambulatory Visit (INDEPENDENT_AMBULATORY_CARE_PROVIDER_SITE_OTHER): Payer: 59 | Admitting: Physician Assistant

## 2019-06-23 ENCOUNTER — Encounter: Payer: Self-pay | Admitting: Physician Assistant

## 2019-06-23 ENCOUNTER — Other Ambulatory Visit: Payer: Self-pay

## 2019-06-23 ENCOUNTER — Encounter: Payer: 59 | Admitting: Family Medicine

## 2019-06-23 VITALS — BP 136/98 | HR 62 | Temp 98.4°F | Ht 66.0 in | Wt 298.5 lb

## 2019-06-23 DIAGNOSIS — Z0001 Encounter for general adult medical examination with abnormal findings: Secondary | ICD-10-CM

## 2019-06-23 DIAGNOSIS — E669 Obesity, unspecified: Secondary | ICD-10-CM

## 2019-06-23 DIAGNOSIS — Z13220 Encounter for screening for lipoid disorders: Secondary | ICD-10-CM | POA: Diagnosis not present

## 2019-06-23 DIAGNOSIS — Z136 Encounter for screening for cardiovascular disorders: Secondary | ICD-10-CM | POA: Diagnosis not present

## 2019-06-23 DIAGNOSIS — Z111 Encounter for screening for respiratory tuberculosis: Secondary | ICD-10-CM

## 2019-06-23 DIAGNOSIS — E282 Polycystic ovarian syndrome: Secondary | ICD-10-CM | POA: Diagnosis not present

## 2019-06-23 DIAGNOSIS — J4520 Mild intermittent asthma, uncomplicated: Secondary | ICD-10-CM

## 2019-06-23 DIAGNOSIS — Z23 Encounter for immunization: Secondary | ICD-10-CM | POA: Diagnosis not present

## 2019-06-23 LAB — CBC WITH DIFFERENTIAL/PLATELET
Basophils Absolute: 0 10*3/uL (ref 0.0–0.1)
Basophils Relative: 0.2 % (ref 0.0–3.0)
Eosinophils Absolute: 0.1 10*3/uL (ref 0.0–0.7)
Eosinophils Relative: 2 % (ref 0.0–5.0)
HCT: 35.6 % — ABNORMAL LOW (ref 36.0–46.0)
Hemoglobin: 11.6 g/dL — ABNORMAL LOW (ref 12.0–15.0)
Lymphocytes Relative: 31.1 % (ref 12.0–46.0)
Lymphs Abs: 2.3 10*3/uL (ref 0.7–4.0)
MCHC: 32.5 g/dL (ref 30.0–36.0)
MCV: 85.3 fl (ref 78.0–100.0)
Monocytes Absolute: 0.6 10*3/uL (ref 0.1–1.0)
Monocytes Relative: 7.5 % (ref 3.0–12.0)
Neutro Abs: 4.4 10*3/uL (ref 1.4–7.7)
Neutrophils Relative %: 59.2 % (ref 43.0–77.0)
Platelets: 238 10*3/uL (ref 150.0–400.0)
RBC: 4.17 Mil/uL (ref 3.87–5.11)
RDW: 14.7 % (ref 11.5–15.5)
WBC: 7.4 10*3/uL (ref 4.0–10.5)

## 2019-06-23 LAB — LIPID PANEL
Cholesterol: 154 mg/dL (ref 0–200)
HDL: 60.7 mg/dL (ref >39.00)
LDL Cholesterol: 75 mg/dL (ref 0–99)
NonHDL: 93.67
Total CHOL/HDL Ratio: 3
Triglycerides: 95 mg/dL (ref 0.0–149.0)
VLDL: 19 mg/dL (ref 0.0–40.0)

## 2019-06-23 LAB — COMPREHENSIVE METABOLIC PANEL
ALT: 25 U/L (ref 0–35)
AST: 17 U/L (ref 0–37)
Albumin: 4.1 g/dL (ref 3.5–5.2)
Alkaline Phosphatase: 60 U/L (ref 39–117)
BUN: 11 mg/dL (ref 6–23)
CO2: 24 mEq/L (ref 19–32)
Calcium: 9.2 mg/dL (ref 8.4–10.5)
Chloride: 106 mEq/L (ref 96–112)
Creatinine, Ser: 0.69 mg/dL (ref 0.40–1.20)
GFR: 121.76 mL/min (ref >60.00)
Glucose, Bld: 84 mg/dL (ref 70–99)
Potassium: 4 mEq/L (ref 3.5–5.1)
Sodium: 140 mEq/L (ref 135–145)
Total Bilirubin: 0.2 mg/dL (ref 0.2–1.2)
Total Protein: 7 g/dL (ref 6.0–8.3)

## 2019-06-23 MED ORDER — ALBUTEROL SULFATE HFA 108 (90 BASE) MCG/ACT IN AERS: 2.0000 | INHALATION_SPRAY | Freq: Four times a day (QID) | RESPIRATORY_TRACT | 0 refills | Status: AC | PRN

## 2019-06-23 MED FILL — ALBUTEROL SULFATE HFA 108 (: 108 (90 BAS | 25 days supply | Qty: 18 | Fill #0

## 2019-06-23 NOTE — Progress Notes (Signed)
I acted as a Education administrator for [employer], PA-C Anselmo Pickler, LPN  Subjective:    Brittney Blevins is a 28 y.o. female and is here for a comprehensive physical exam.  HPI  There are no preventive care reminders to display for this patient.  Acute Concerns: Asthma -- she states that she notes that she has had some feelings of claustrophobia or brief moments of SOB when she is wearing her mask. She states that mom has asthma. She is usually able to get her breathing under control. Does not have inhaler. Does not have nighttime awakenings. 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 Has never taken metformin. Has had slight weight gain over the past year.  Health Maintenance: Immunizations -- Tdap given today Colonoscopy -- N/A Mammogram -- N/A PAP -- 2016 normal, pt is on menstrual cycle today and is moving back home will have pap done there. Bone Density -- N/A Diet -- "not great" Sleep habits -- works third shift but sleeps well when she can Exercise -- recently bought some home gym equipment Current Weight -- Weight: 298 lb 8 oz (135.4 kg)  Weight History: Wt Readings from Last 10 Encounters:  06/23/19 298 lb 8 oz (135.4 kg)  11/14/18 286 lb 3.2 oz (129.8 kg)  06/20/18 277 lb 3.2 oz (125.7 kg)  09/16/13 236 lb 9.6 oz (107.3 kg)  03/17/13 219 lb (99.3 kg)  02/19/13 216 lb (98 kg)  01/15/13 222 lb 9.6 oz (101 kg)   Mood -- good Patient's last menstrual period was 06/21/2019.   Depression screen PHQ 2/9 06/23/2019  Decreased Interest 0  Down, Depressed, Hopeless 0  PHQ - 2 Score 0     Other providers/specialists: Patient Care Team: Orma Flaming, MD as PCP - General (Family Medicine)   PMHx, SurgHx, SocialHx, Medications, and Allergies were reviewed in the Visit Navigator  and updated as appropriate.   Past Medical History:  Diagnosis Date  . Cholelithiasis 02-19-13   surgery planned 02-28-13  . Chronic cholecystitis with calculus 01/15/2013   s/p lap chole 02/28/2013  . Frequent headaches   . Heart murmur      Past Surgical History:  Procedure Laterality Date  . CHOLECYSTECTOMY    . LAPAROSCOPIC CHOLECYSTECTOMY SINGLE PORT N/A 02/28/2013   Procedure: LAPAROSCOPIC CHOLECYSTECTOMY SINGLE SITE;  Surgeon: Adin Hector, MD;  Location: [facility];  Service: General;  Laterality: N/A;     Family History  Problem Relation Age of Onset  . Asthma Mother   . Miscarriages / [place] Mother   . Diabetes Paternal Grandmother   . Stroke Paternal Grandmother   . Heart attack Paternal Grandfather     Social History   Tobacco Use  . Smoking status: Never Smoker  . Smokeless tobacco: Never Used  Substance Use Topics  . Alcohol use: Yes    Comment: rare-Social  . Drug use: No    Review of Systems:   Review of Systems  Constitutional: Negative for chills, fever, malaise/fatigue and weight loss.  HENT: Negative for hearing loss, sinus pain and sore throat.   Respiratory: Positive for shortness of breath. Negative for cough and hemoptysis.   Cardiovascular: Negative for chest pain, palpitations, leg swelling and PND.  Gastrointestinal: Negative for abdominal pain, constipation, diarrhea, heartburn, nausea and vomiting.  Genitourinary: Negative for  dysuria, frequency and urgency.  Musculoskeletal: Negative for back pain, myalgias and neck pain.  Skin: Negative for itching and rash.  Neurological: Negative for dizziness, tingling, seizures and headaches.  Endo/Heme/Allergies: Negative for polydipsia.  Psychiatric/Behavioral: Negative for depression. The patient is not nervous/anxious.     Objective:   BP (!) 136/98 (BP Location: Left Arm, Patient Position: Sitting, Cuff Size: Large)   Pulse 62   Temp 98.4 F (36.9 C) (Oral)   Ht 5\' 6"  (1.676 m)   Wt  298 lb 8 oz (135.4 kg)   LMP 06/21/2019   SpO2 98%   BMI 48.18 kg/m   General Appearance:    Alert, cooperative, no distress, appears stated age  Head:    Normocephalic, without obvious abnormality, atraumatic  Eyes:    PERRL, conjunctiva/corneas clear, EOM's intact, fundi    benign, both eyes  Ears:    Normal TM's and external ear canals, both ears  Nose:   Nares normal, septum midline, mucosa normal, no drainage    or sinus tenderness  Throat:   Lips, mucosa, and tongue normal; teeth and gums normal  Neck:   Supple, symmetrical, trachea midline, no adenopathy;    thyroid:  no enlargement/tenderness/nodules; no carotid   bruit or JVD  Back:     Symmetric, no curvature, ROM normal, no CVA tenderness  Lungs:     Clear to auscultation bilaterally, respirations unlabored  Chest Wall:    No tenderness or deformity   Heart:    Regular rate and rhythm, S1 and S2 normal, no murmur, rub   or gallop  Breast Exam:    Deferred  Abdomen:     Soft, non-tender, bowel sounds active all four quadrants,    no masses, no organomegaly  Genitalia:    Deferred  Rectal:    Deferred  Extremities:   Extremities normal, atraumatic, no cyanosis or edema  Pulses:   2+ and symmetric all extremities  Skin:   Skin color, texture, turgor normal, no rashes or lesions  Lymph nodes:   Cervical, supraclavicular, and axillary nodes normal  Neurologic:   CNII-XII intact, normal strength, sensation and reflexes    throughout     Assessment/Plan:   Gerard was seen today for annual exam.  Diagnoses and all orders for this visit:  Encounter for general adult medical examination with abnormal findings Today patient counseled on age appropriate routine health concerns for screening and prevention, each reviewed and up to date or declined. Immunizations reviewed and up to date or declined. Labs ordered and reviewed. Risk factors for depression reviewed and negative. Hearing function and visual acuity are intact. ADLs  screened and addressed as needed. Functional ability and level of safety reviewed and appropriate. Education, counseling and referrals performed based on assessed risks today. Patient provided with a copy of personalized plan for preventive services. -     CBC with Differential/Platelet -     Comprehensive metabolic panel  Need for prophylactic vaccination with combined diphtheria-tetanus-pertussis (DTP) vaccine -     Tdap vaccine greater than or equal to 7yo IM  PCOS (polycystic ovarian syndrome) Well controlled per patient.  Obesity Continues with weight gain. Discussed need for healthier diet choices and utilizing home gym more often.   Encounter for lipid screening for cardiovascular disease -     Lipid panel  Mild intermittent asthma without complication Well controlled. Albuterol prn just as a rescue to have on hand. Follow-up with new PCP if not well controlled.  Tuberculosis screening -  QuantiFERON-TB Gold Plus  Other orders -     albuterol (VENTOLIN HFA) 108 (90 Base) MCG/ACT inhaler; Inhale 2 puffs into the lungs every 6 (six) hours as needed for wheezing or shortness of breath.   Well Adult Exam: Labs ordered: Yes. Patient counseling was done. See below for items discussed. Discussed the patient's BMI. The BMI is not in the acceptable range; BMI management plan is completed Follow up with new PCP in 3-6 months.  Patient Counseling:   [x]     Nutrition: Stressed importance of moderation in sodium/caffeine intake, saturated fat and cholesterol, caloric balance, sufficient intake of fresh fruits, vegetables, fiber, calcium, iron, and 1 mg of folate supplement per day (for females capable of pregnancy).   [x]      Stressed the importance of regular exercise.    [x]     Substance Abuse: Discussed cessation/primary prevention of tobacco, alcohol, or other drug use; driving or other dangerous activities under the influence; availability of treatment for abuse.    [x]       Injury prevention: Discussed safety belts, safety helmets, smoke detector, smoking near bedding or upholstery.    [x]      Sexuality: Discussed sexually transmitted diseases, partner selection, use of condoms, avoidance of unintended pregnancy  and contraceptive alternatives.    [x]     Dental health: Discussed importance of regular tooth brushing, flossing, and dental visits.   [x]      Health maintenance and immunizations reviewed. Please refer to Health maintenance section.   CMA or LPN served as scribe during this visit. History, Physical, and Plan performed by medical provider. The above documentation has been reviewed and is accurate and complete.  Jarold MottoSamantha Keyaria Lawson, PA-C [facility]

## 2019-06-23 NOTE — Patient Instructions (Signed)
It was great to see you!  Albuterol inhaler has been sent in for you.  Please go to the lab for blood work.   Our office will call you with your results unless you have chosen to receive results via MyChart.  If your blood work is normal we will follow-up each year for physicals and as scheduled for chronic medical problems.  If anything is abnormal we will treat accordingly and get you in for a follow-up.  Take care,  [facility] Maintenance, Female Adopting a healthy lifestyle and getting preventive care are important in promoting health and wellness. Ask your health care provider about:  The right schedule for you to have regular tests and exams.  Things you can do on your own to prevent diseases and keep yourself healthy. What should I know about diet, weight, and exercise? Eat a healthy diet   Eat a diet that includes plenty of vegetables, fruits, low-fat dairy products, and lean protein.  Do not eat a lot of foods that are high in solid fats, added sugars, or sodium. Maintain a healthy weight Body mass index (BMI) is used to identify weight problems. It estimates body fat based on height and weight. Your health care provider can help determine your BMI and help you achieve or maintain a healthy weight. Get regular exercise Get regular exercise. This is one of the most important things you can do for your health. Most adults should:  Exercise for at least 150 minutes each week. The exercise should increase your heart rate and make you sweat (moderate-intensity exercise).  Do strengthening exercises at least twice a week. This is in addition to the moderate-intensity exercise.  Spend less time sitting. Even light physical activity can be beneficial. Watch cholesterol and blood lipids Have your blood tested for lipids and cholesterol at 29 years of age, then have this test every 5 years. Have your cholesterol levels checked more often if:  Your lipid or  cholesterol levels are high.  You are older than 29 years of age.  You are at high risk for heart disease. What should I know about cancer screening? Depending on your health history and family history, you may need to have cancer screening at various ages. This may include screening for:  Breast cancer.  Cervical cancer.  Colorectal cancer.  Skin cancer.  Lung cancer. What should I know about heart disease, diabetes, and high blood pressure? Blood pressure and heart disease  High blood pressure causes heart disease and increases the risk of stroke. This is more likely to develop in people who have high blood pressure readings, are of African descent, or are overweight.  Have your blood pressure checked: ? Every 3-5 years if you are 56-23 years of age. ? Every year if you are 50 years old or older. Diabetes Have regular diabetes screenings. This checks your fasting blood sugar level. Have the screening done:  Once every three years after age 54 if you are at a normal weight and have a low risk for diabetes.  More often and at a younger age if you are overweight or have a high risk for diabetes. What should I know about preventing infection? Hepatitis B If you have a higher risk for hepatitis B, you should be screened for this virus. Talk with your health care provider to find out if you are at risk for hepatitis B infection. Hepatitis C Testing is recommended for:  Everyone born from [birth year] through 1966.  Anyone with known risk factors for hepatitis C. Sexually transmitted infections (STIs)  Get screened for STIs, including gonorrhea and chlamydia, if: ? You are sexually active and are younger than 29 years of age. ? You are older than 29 years of age and your health care provider tells you that you are at risk for this type of infection. ? Your sexual activity has changed since you were last screened, and you are at increased risk for chlamydia or gonorrhea. Ask your  health care provider if you are at risk.  Ask your health care provider about whether you are at high risk for HIV. Your health care provider may recommend a prescription medicine to help prevent HIV infection. If you choose to take medicine to prevent HIV, you should first get tested for HIV. You should then be tested every 3 months for as long as you are taking the medicine. Pregnancy  If you are about to stop having your period (premenopausal) and you may become pregnant, seek counseling before you get pregnant.  Take 400 to 800 micrograms (mcg) of folic acid every day if you become pregnant.  Ask for birth control (contraception) if you want to prevent pregnancy. Osteoporosis and menopause Osteoporosis is a disease in which the bones lose minerals and strength with aging. This can result in bone fractures. If you are 67 years old or older, or if you are at risk for osteoporosis and fractures, ask your health care provider if you should:  Be screened for bone loss.  Take a calcium or vitamin D supplement to lower your risk of fractures.  Be given hormone replacement therapy (HRT) to treat symptoms of menopause. Follow these instructions at home: Lifestyle  Do not use any products that contain nicotine or tobacco, such as cigarettes, e-cigarettes, and chewing tobacco. If you need help quitting, ask your health care provider.  Do not use street drugs.  Do not share needles.  Ask your health care provider for help if you need support or information about quitting drugs. Alcohol use  Do not drink alcohol if: ? Your health care provider tells you not to drink. ? You are pregnant, may be pregnant, or are planning to become pregnant.  If you drink alcohol: ? Limit how much you use to 0-1 drink a day. ? Limit intake if you are breastfeeding.  Be aware of how much alcohol is in your drink. In the [place] one drink equals one 12 oz bottle of beer (355 mL), one 5 oz glass of wine (148  mL), or one 1 oz glass of hard liquor (44 mL). General instructions  Schedule regular health, dental, and eye exams.  Stay current with your vaccines.  Tell your health care provider if: ? You often feel depressed. ? You have ever been abused or do not feel safe at home. Summary  Adopting a healthy lifestyle and getting preventive care are important in promoting health and wellness.  Follow your health care provider's instructions about healthy diet, exercising, and getting tested or screened for diseases.  Follow your health care provider's instructions on monitoring your cholesterol and blood pressure. This information is not intended to replace advice given to you by your health care provider. Make sure you discuss any questions you have with your health care provider. Document Released: 06/26/2011 Document Revised: 12/04/2018 Document Reviewed: 12/04/2018 Elsevier Patient Education  2020 [employer].

## 2019-06-25 LAB — QUANTIFERON-TB GOLD PLUS
Mitogen-NIL: >10 IU/mL
NIL: 0.02 IU/mL
QuantiFERON-TB Gold Plus: NEGATIVE
TB1-NIL: 0.01 IU/mL
TB2-NIL: 0 IU/mL

## 2019-06-26 ENCOUNTER — Telehealth: Payer: Self-pay

## 2019-06-26 NOTE — Telephone Encounter (Signed)
Copied from [place] [id number]. Topic: General - Call Back - No Documentation >> Jun 26, 2019  4:08 PM Erick Blinks wrote: Reason for CRM: Call back request  [phone]

## 2019-06-26 NOTE — Telephone Encounter (Signed)
Spoke to patient and advised forms are ready for pick up

## 2019-07-01 DIAGNOSIS — L732 Hidradenitis suppurativa: Secondary | ICD-10-CM | POA: Diagnosis not present

## 2019-07-03 ENCOUNTER — Telehealth: Payer: Self-pay | Admitting: Physical Therapy

## 2019-07-03 NOTE — Telephone Encounter (Signed)
Copied from [place] [phone]. Topic: General - Other >> Jul 03, 2019  2:32 PM Mcneil, Ja-Kwan wrote: Reason for CRM: Pt called for an update on the paperwork. Pt stated she will be moving on Saturday and really needs the paperwork before she leaves. Pt requests call back when paperwork is ready for pickup.

## 2019-07-03 NOTE — Telephone Encounter (Signed)
im not sure what paperwork she is needing. Brittney Blevins has messages in here from June regarding paperwork. Can we call and follow up before she moves on Friday please?  Thanks! Orma Flaming, MD [place]

## 2019-07-04 NOTE — Telephone Encounter (Signed)
Called patient no answer left VM to call office.

## 2019-07-04 NOTE — Telephone Encounter (Signed)
Pt calling back. Nurse unavailable. Please call back.

## 2019-07-04 NOTE — Telephone Encounter (Signed)
Patient came into pick up immunization paperwork that was completed by Inda Coke.

## 2019-07-04 NOTE — Telephone Encounter (Signed)
See note

## 2019-07-07 MED FILL — CLINDAMYCIN PHOSP 1% LOTION: 1 | 30 days supply | Qty: 60 | Fill #0

## 2019-11-25 ENCOUNTER — Encounter: Payer: Self-pay | Admitting: Physician Assistant

## 2019-11-27 ENCOUNTER — Telehealth: Payer: Self-pay | Admitting: Family Medicine

## 2019-11-27 NOTE — Telephone Encounter (Signed)
Please advise   Copied from [place] [phone]. Topic: General - Other >> Nov 27, 2019 12:50 PM Lennox Solders wrote: Reason for CRM:  pt is calling and would like to have the form mailed to her and she will get hearing and vision test at urgent care. Pt lives 3 hrs away.

## 2019-11-27 NOTE — Telephone Encounter (Signed)
Sending to sam

## 2019-11-28 NOTE — Telephone Encounter (Signed)
Please mail form to patient, it is completed and in my "signed" folder.

## 2019-11-28 NOTE — Telephone Encounter (Signed)
Form mailed to patient.

## 2019-12-04 NOTE — Telephone Encounter (Signed)
Patient calling to inquire if it is possible to upload form onto her [place]. Patient has not received form in mail and it was due today.

## 2019-12-05 NOTE — Telephone Encounter (Signed)
Noted  

## 2019-12-05 NOTE — Telephone Encounter (Signed)
Sending to sam 

## 2019-12-05 NOTE — Telephone Encounter (Signed)
See note

## 2019-12-05 NOTE — Telephone Encounter (Signed)
Maddy sent form again to pt's email.

## 2019-12-05 NOTE — Telephone Encounter (Signed)
Spoke to pt told her I am not sure why she did receive the form it was mailed on the 4 th. Told pt I have another form ready for her to pickup will put at the front desk there is no way to load to My Chart, sorry. Pt verbalized understanding and said she is 3 hours away since she moved. Told pt let me check with someone to see if there is a way to get this to you in your chart and I will call you back. Pt verbalized understanding.

## 2019-12-05 NOTE — Telephone Encounter (Signed)
Patient calling to state she has received form.

## 2019-12-05 NOTE — Telephone Encounter (Signed)
Spoke to schedulers up front they said they can send via email thru the copier, just need email.  Called pt back and told her we can email her the form thru our copier. What is your email? Pt said [email or web address]. Told her okay I will have them send it now. Pt verbalized understanding.  Maddy schedule upfront given information and she is emailing form now to pt.

## 2019-12-05 NOTE — Telephone Encounter (Signed)
Spoke to pt told her checking to see if she received email. Pt checked while on phone with me and she still did not receive it, checked her spam too. Told her I will call back and check later. Pt verbalized understanding.

## 2020-08-11 ENCOUNTER — Telehealth: Payer: Self-pay

## 2020-08-11 ENCOUNTER — Other Ambulatory Visit: Payer: Self-pay

## 2020-08-11 ENCOUNTER — Other Ambulatory Visit: Payer: BC Managed Care – PPO

## 2020-08-11 DIAGNOSIS — Z111 Encounter for screening for respiratory tuberculosis: Secondary | ICD-10-CM

## 2020-08-11 NOTE — Telephone Encounter (Signed)
Please let her know I have order in, just needs to make lab appointment.  Dr. Artis Flock

## 2020-08-11 NOTE — Telephone Encounter (Signed)
Pt is requesting a blood TB test for school.

## 2020-08-13 ENCOUNTER — Other Ambulatory Visit: Payer: BC Managed Care – PPO

## 2020-08-14 LAB — QUANTIFERON-TB GOLD PLUS
Mitogen-NIL: >10 IU/mL
NIL: 0.02 IU/mL
QuantiFERON-TB Gold Plus: NEGATIVE
TB1-NIL: 0 IU/mL
TB2-NIL: 0 IU/mL

## 2024-08-25 HISTORY — PX: OTHER SURGICAL HISTORY: SHX169

## 2024-12-07 ENCOUNTER — Encounter: Payer: Self-pay | Admitting: Emergency Medicine

## 2024-12-07 ENCOUNTER — Ambulatory Visit
Admission: EM | Admit: 2024-12-07 | Discharge: 2024-12-07 | Disposition: A | Discharge status: Home / Self Care | Payer: No Typology Code available for payment source

## 2024-12-07 DIAGNOSIS — K0889 Other specified disorders of teeth and supporting structures: Secondary | ICD-10-CM | POA: Diagnosis not present

## 2024-12-07 MED ORDER — HYDROCODONE-ACETAMINOPHEN 5-325 MG PO TABS: 1.0000 | ORAL_TABLET | ORAL | 0 refills | Status: AC | PRN

## 2024-12-07 NOTE — ED Provider Notes (Signed)
 [facility]    CSN: [id number] Arrival date & time: 12/07/24  0846      History   Chief Complaint Chief Complaint  Patient presents with   Dental Pain    HPI Brittney Blevins is a 33 y.o. female.   Pt present today due to 14 days of dental pain. Pt states that she has has several dental surgeries done recently and she believes that she has wisdom teeth starting to grow in. Pt states that she has been using 800 mg of ibuprofen every 8 hrs and 1000 mg of Tylenol  every 6 hrs as needed Pt states that her pain is usually well controlled with this regimen. Pt states that she is out of town for school and does not know of the dental resources in this area.   The history is provided by the patient.  Dental Pain   Past Medical History:  Diagnosis Date   Cholelithiasis 02/19/2013   surgery planned 02-28-13   Chronic cholecystitis with calculus 01/15/2013   s/p lap chole 02/28/2013   Frequent headaches    Heart murmur    Hypertension     Patient Active Problem List   Diagnosis Date Noted   Essential hypertension 09/22/2024   Encounter for preventive health examination 09/05/2024   Low back pain 09/05/2024   Vitamin D deficiency 09/05/2024   PCOS (polycystic ovarian syndrome) 07/04/2018   Irregular heart beat 06/20/2018   Obesity (BMI 30-39.9) 01/15/2013    Past Surgical History:  Procedure Laterality Date   CHOLECYSTECTOMY     dental surgeries   08/2024   LAPAROSCOPIC CHOLECYSTECTOMY SINGLE PORT N/A 02/28/2013   Procedure: LAPAROSCOPIC CHOLECYSTECTOMY SINGLE SITE;  Surgeon: Elspeth KYM Schultze, MD;  Location: [facility];  Service: General;  Laterality: N/A;    OB History   No obstetric history on file.      Home Medications    Prior to Admission medications  Medication Sig Start Date End Date Taking? Authorizing Provider  amLODipine (NORVASC) 5 MG tablet Take 5 mg by mouth every morning. 11/24/24 11/24/25 Yes [provider]  amoxicillin (AMOXIL) 500  MG capsule Take 500 mg by mouth 3 (three) times daily. 09/18/24  Yes [provider]  chlorhexidine  (PERIDEX ) 0.12 % solution [id number] Milliliter(s) 3 Times Daily 09/18/24  Yes [provider]  cholestyramine light (PREVALITE) 4 g packet Take by mouth. 10/07/24  Yes [provider]  HYDROcodone -acetaminophen  (NORCO/VICODIN) 5-325 MG tablet Take 1 tablet by mouth every 4 (four) hours as needed. 12/07/24  Yes Andra Krabbe C, PA-C  Vitamin D, Ergocalciferol, (DRISDOL) 1.25 MG (50000 UNIT) CAPS capsule Take 50,000 Units by mouth once a week. 09/18/24  Yes [provider]  albuterol  (VENTOLIN  HFA) 108 (90 Base) MCG/ACT inhaler Inhale 2 puffs into the lungs every 6 (six) hours as needed for wheezing or shortness of breath. 06/23/19   Job Lukes, PA  CHLOROPHYLL PO Take 10 mLs by mouth daily.    [provider]  Multiple Vitamin (MULTIVITAMIN) tablet Take 1 tablet by mouth daily.    [provider]  Probiotic Product (PROBIOTIC-10 PO) Take 1 tablet by mouth daily.    [provider]    Family History Family History  Problem Relation Age of Onset   Asthma Mother    Miscarriages / Stillbirths Mother    Diabetes Paternal Grandmother    Stroke Paternal Grandmother    Heart attack Paternal Grandfather     Social History Social History[1]   Allergies  Patient has no known allergies.   Review of Systems Review of Systems   Physical Exam Triage Vital Signs ED Triage Vitals  Encounter Vitals Group     BP 12/07/24 0909 (!) 138/95     Girls Systolic BP Percentile --      Girls Diastolic BP Percentile --      Boys Systolic BP Percentile --      Boys Diastolic BP Percentile --      Pulse Rate 12/07/24 0909 78     Resp 12/07/24 0909 18     Temp 12/07/24 0909 98.8 F (37.1 C)     Temp Source 12/07/24 0909 Oral     SpO2 12/07/24 0909 98 %     Weight 12/07/24 0908 290 lb (131.5 kg)     Height --      Head  Circumference --      Peak Flow --      Pain Score 12/07/24 0905 4     Pain Loc --      Pain Education --      Exclude from Growth Chart --    No data found.  Updated Vital Signs BP (!) 138/95 (BP Location: Left Arm)   Pulse 78   Temp 98.8 F (37.1 C) (Oral)   Resp 18   Wt 290 lb (131.5 kg)   LMP 11/25/2024 (Exact Date)   SpO2 98%   BMI 46.81 kg/m   Visual Acuity Right Eye Distance:   Left Eye Distance:   Bilateral Distance:    Right Eye Near:   Left Eye Near:    Bilateral Near:     Physical Exam Vitals and nursing note reviewed.  Constitutional:      General: She is not in acute distress.    Appearance: Normal appearance. She is not ill-appearing, toxic-appearing or diaphoretic.  HENT:     Mouth/Throat:      Comments: Dental pain where indicated in diagram  Eyes:     General: No scleral icterus. Cardiovascular:     Rate and Rhythm: Normal rate and regular rhythm.     Heart sounds: Normal heart sounds.  Pulmonary:     Effort: Pulmonary effort is normal. No respiratory distress.     Breath sounds: Normal breath sounds. No wheezing or rhonchi.  Skin:    General: Skin is warm.  Neurological:     Mental Status: She is alert and oriented to person, place, and time.  Psychiatric:        Behavior: Behavior normal.      UC Treatments / Results  Labs (all labs ordered are listed, but only abnormal results are displayed) Labs Reviewed - No data to display  EKG   Radiology No results found.  Procedures Procedures (including critical care time)  Medications Ordered in UC Medications - No data to display  Initial Impression / Assessment and Plan / UC Course  I have reviewed the triage vital signs and the nursing notes.  Pertinent labs & imaging results that were available during my care of the patient were reviewed by me and considered in my medical decision making (see chart for details).     Final Clinical Impressions(s) / UC Diagnoses   Final  diagnoses:  Pain, dental     Discharge Instructions      Please follow-up with dental urgent    ED Prescriptions     Medication Sig Dispense Auth. Provider   HYDROcodone -acetaminophen  (NORCO/VICODIN) 5-325 MG tablet Take 1 tablet by mouth  every 4 (four) hours as needed. 5 tablet Andra Corean BROCKS, PA-C      I have reviewed the PDMP during this encounter.    [1]  Social History Tobacco Use   Smoking status: Never    Passive exposure: Never   Smokeless tobacco: Never  Vaping Use   Vaping status: Never Used  Substance Use Topics   Alcohol use: Yes    Comment: rare-Social   Drug use: No     Andra Corean BROCKS, PA-C 12/07/24 [phone]

## 2024-12-07 NOTE — Discharge Instructions (Addendum)
 Please follow-up with dental urgent

## 2024-12-07 NOTE — ED Triage Notes (Signed)
 Pt presents c/o dental pain x 14 days. Pt states,  This is day 9 of me taking ibuprofen and Tylenol  daily. This morning the pain was so bad I almost went to the ER. I have had a lot of dental surgeries and I was told I would never grown wisdom teeth but I think that is what is happening now. I haven't had a fever that I know of but I have also been taking the Tylenol  and Ibuprofen consistently.

## 2024-12-08 ENCOUNTER — Ambulatory Visit: Payer: Self-pay
